# Patient Record
Sex: Male | Born: 1965
Health system: Southern US, Community
[De-identification: ages and names within clinical notes are randomized; demographics above are authoritative.]

## PROBLEM LIST (undated history)

## (undated) DIAGNOSIS — R079 Chest pain, unspecified: Secondary | ICD-10-CM

## (undated) DIAGNOSIS — K219 Gastro-esophageal reflux disease without esophagitis: Secondary | ICD-10-CM

## (undated) HISTORY — DX: Chest pain, unspecified: R07.9

## (undated) HISTORY — DX: Gastro-esophageal reflux disease without esophagitis: K21.9

---

## 2011-11-25 ENCOUNTER — Ambulatory Visit (INDEPENDENT_AMBULATORY_CARE_PROVIDER_SITE_OTHER): Payer: BC Managed Care – PPO | Admitting: Family Medicine

## 2011-11-25 VITALS — BP 129/69 | HR 80 | Temp 97.4°F | Resp 16 | Ht 70.0 in | Wt 187.0 lb

## 2011-11-25 DIAGNOSIS — R079 Chest pain, unspecified: Secondary | ICD-10-CM

## 2011-11-25 NOTE — Progress Notes (Signed)
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 His wife made him come in today because he mentioned pain. Patient's pain began on Thursday after work and it feels like the same kind of pain he had after eating onions in the past. Feeling in his lower chest anteriorly. The pain is coming on (intermittent) and although it started as an 8/10, currently at 1/10. Patient thinks may be Collins on Wednesday night and this started the problem.  Patient's risk factors for coronary disease include early death of father at age 13 of heart disease, cigarette smoking, and heavy alcohol use. He has no history of high blood pressure and he does not know his cholesterol.  Currently patient says that he feels like he has a "soft spot) over his lower sternum. The pain is non-positional and does not change with activity.  Patient denies dizziness, shortness of breath, swelling, weakness, or inability to work.  Objective: Patient is happy, pleasant and articulate. He is seen with his wife in the room.  HEENT: Unremarkable Neck: Supple no adenopathy Body habitus: Patient looks extremely fit and then. Chest: Clear Heart: Regular, no murmur or gallop Abdomen: Soft nontender without HSM or masses.  EKG: Normal sinus rhythm  Assessment:  No evidence for coronary disease at this point nevertheless patient does have some significant risk factors for heart disease. I've encouraged him to cut down smoking and drinking. Needs to eliminate the smoking and limit drinking 2 beers per day.  Plan:  Cardiac referral, ranitidine when necessary, work on cigarette reduction

## 2011-11-25 NOTE — Patient Instructions (Signed)
Chest Pain (Nonspecific) It is often hard to give a specific diagnosis for the cause of chest pain. There is always a chance that your pain could be related to something serious, such as a heart attack or a blood clot in the lungs. You need to follow up with your caregiver for further evaluation. CAUSES   Heartburn.   Pneumonia or bronchitis.   Anxiety or stress.   Inflammation around your heart (pericarditis) or lung (pleuritis or pleurisy).   A blood clot in the lung.   A collapsed lung (pneumothorax). It can develop suddenly on its own (spontaneous pneumothorax) or from injury (trauma) to the chest.   Shingles infection (herpes zoster virus).  The chest wall is composed of bones, muscles, and cartilage. Any of these can be the source of the pain.  The bones can be bruised by injury.   The muscles or cartilage can be strained by coughing or overwork.   The cartilage can be affected by inflammation and become sore (costochondritis).  DIAGNOSIS  Lab tests or other studies, such as X-rays, electrocardiography, stress testing, or cardiac imaging, may be needed to find the cause of your pain.  TREATMENT   Treatment depends on what may be causing your chest pain. Treatment may include:   Acid blockers for heartburn.   Anti-inflammatory medicine.   Pain medicine for inflammatory conditions.   Antibiotics if an infection is present.   You may be advised to change lifestyle habits. This includes stopping smoking and avoiding alcohol, caffeine, and chocolate.   You may be advised to keep your head raised (elevated) when sleeping. This reduces the chance of acid going backward from your stomach into your esophagus.   Most of the time, nonspecific chest pain will improve within 2 to 3 days with rest and mild pain medicine.  HOME CARE INSTRUCTIONS   If antibiotics were prescribed, take your antibiotics as directed. Finish them even if you start to feel better.   For the next few  days, avoid physical activities that bring on chest pain. Continue physical activities as directed.   Do not smoke.   Avoid drinking alcohol.   Only take over-the-counter or prescription medicine for pain, discomfort, or fever as directed by your caregiver.   Follow your caregiver's suggestions for further testing if your chest pain does not go away.   Keep any follow-up appointments you made. If you do not go to an appointment, you could develop lasting (chronic) problems with pain. If there is any problem keeping an appointment, you must call to reschedule.  SEEK MEDICAL CARE IF:   You think you are having problems from the medicine you are taking. Read your medicine instructions carefully.   Your chest pain does not go away, even after treatment.   You develop a rash with blisters on your chest.  SEEK IMMEDIATE MEDICAL CARE IF:   You have increased chest pain or pain that spreads to your arm, neck, jaw, back, or abdomen.   You develop shortness of breath, an increasing cough, or you are coughing up blood.   You have severe back or abdominal pain, feel nauseous, or vomit.   You develop severe weakness, fainting, or chills.   You have a fever.  THIS IS AN EMERGENCY. Do not wait to see if the pain will go away. Get medical help at once. Call your local emergency services (911 in [place] Do not drive yourself to the hospital. MAKE SURE YOU:   Understand these instructions.     Will watch your condition.   Will get help right away if you are not doing well or get worse.  Document Released: 03/21/2005 Document Revised: 05/31/2011 Document Reviewed: 01/15/2008 Exit[employer] Patient Information 2012 [employer]. 

## 2011-12-26 ENCOUNTER — Encounter: Payer: BC Managed Care – PPO | Admitting: Cardiovascular Disease

## 2011-12-26 ENCOUNTER — Encounter: Payer: Self-pay | Admitting: Cardiovascular Disease

## 2011-12-26 ENCOUNTER — Ambulatory Visit (INDEPENDENT_AMBULATORY_CARE_PROVIDER_SITE_OTHER): Payer: BC Managed Care – PPO | Admitting: Cardiovascular Disease

## 2011-12-26 DIAGNOSIS — R079 Chest pain, unspecified: Secondary | ICD-10-CM

## 2011-12-26 DIAGNOSIS — F17200 Nicotine dependence, unspecified, uncomplicated: Secondary | ICD-10-CM

## 2011-12-26 DIAGNOSIS — K219 Gastro-esophageal reflux disease without esophagitis: Secondary | ICD-10-CM | POA: Insufficient documentation

## 2011-12-26 NOTE — Patient Instructions (Signed)
Your physician recommends that you schedule a follow-up appointment in: AS NEEDED  Your physician has requested that you have an exercise tolerance test. For further information please visit [email or web address]. Please also follow instruction sheet, as given. DX CHEST PAIN 

## 2011-12-26 NOTE — Assessment & Plan Note (Signed)
See above.  Family history and smoker Normal ECG  F/U ETT

## 2011-12-26 NOTE — Assessment & Plan Note (Signed)
Releated to onion ingestion Continue zantac Chest pain likely related to esophageal spasm

## 2011-12-26 NOTE — Assessment & Plan Note (Signed)
Counseled for less than 10 minutes  Wife is supportive Will call in welbutrin if needed.  Work related peer pressure.

## 2011-12-26 NOTE — Progress Notes (Signed)
Patient ID: Dale Butler, male   DOB: Mar 19, 1966, 46 y.o.   MRN: [id number] 46 yo referred by Dr Milus Glazier for chest pain.  Had episode at work going into [employer], structural Occurred after lunch and having onions.  This has happened in past and has GI overtones.  Squeezing in center of chest.  Lasted about 40 minutes.  Associated diaphoresis and dyspnea.  Family history of CAD.  Smokes 4 cigs/day.  On no meds.  No exertional pains in general.  Has not had recurrence last couple of weeks.  ECG was normal  ROS: Denies fever, malais, weight loss, blurry vision, decreased visual acuity, cough, sputum, SOB, hemoptysis, pleuritic pain, palpitaitons, heartburn, abdominal pain, melena, lower extremity edema, claudication, or rash.  All other systems reviewed and negative   General: Affect appropriate Healthy:  appears stated age HEENT: normal Neck supple with no adenopathy JVP normal no bruits no thyromegaly Lungs clear with no wheezing and good diaphragmatic motion Heart:  S1/S2 no murmur,rub, gallop or click PMI normal Abdomen: benighn, BS positve, no tenderness, no AAA no bruit.  No HSM or HJR Distal pulses intact with no bruits No edema Neuro non-focal Skin warm and dry No muscular weakness  Medications Current Outpatient Prescriptions  Medication Sig Dispense Refill  . ranitidine (ZANTAC) 150 MG tablet Take 150 mg by mouth 2 (two) times daily. If needed        Allergies Review of patient's allergies indicates no known allergies.  Family History: Family History  Problem Relation Age of Onset  . Hypertension Father   . Heart disease Father   . Alcohol abuse Brother     Social History: History   Social History  . Marital Status: Married    Spouse Name: N/A    Number of Children: N/A  . Years of Education: N/A   Occupational History  . Not on file.   Social History Main Topics  . Smoking status: Current Everyday Smoker  . Smokeless tobacco: Not on file  . Alcohol Use: Not on  file  . Drug Use: Not on file  . Sexually Active: Not on file   Other Topics Concern  . Not on file   Social History Narrative  . No narrative on file    Electrocardiogram:  NSR normal ecg 11/26/11  Assessment and Plan

## 2011-12-27 NOTE — Progress Notes (Signed)
Patient ID: Dale Butler, male   DOB: 30-Jan-1966, 46 y.o.   MRN: [id number] Pt seen by Dr. Eden Emms. cdm

## 2011-12-27 NOTE — Progress Notes (Signed)
Pt seen by Nishan. cdm

## 2012-01-17 ENCOUNTER — Encounter: Payer: BC Managed Care – PPO | Admitting: Nurse Practitioner

## 2012-08-06 ENCOUNTER — Ambulatory Visit (INDEPENDENT_AMBULATORY_CARE_PROVIDER_SITE_OTHER): Payer: BC Managed Care – PPO | Admitting: Family Medicine

## 2012-08-06 VITALS — BP 120/82 | HR 97 | Temp 97.4°F | Resp 16 | Ht 71.5 in | Wt 193.6 lb

## 2012-08-06 DIAGNOSIS — Z23 Encounter for immunization: Secondary | ICD-10-CM

## 2012-08-06 DIAGNOSIS — R21 Rash and other nonspecific skin eruption: Secondary | ICD-10-CM

## 2012-08-06 DIAGNOSIS — Z0000 Encounter for general adult medical examination without abnormal findings: Secondary | ICD-10-CM

## 2012-08-06 LAB — LIPID PANEL
HDL: 47 mg/dL (ref >39)
LDL Cholesterol: 140 mg/dL — ABNORMAL HIGH (ref 0–99)
Triglycerides: 296 mg/dL — ABNORMAL HIGH (ref <150)
VLDL: 59 mg/dL — ABNORMAL HIGH (ref 0–40)

## 2012-08-06 LAB — COMPREHENSIVE METABOLIC PANEL
ALT: 17 U/L (ref 0–53)
AST: 33 U/L (ref 0–37)
Albumin: 4.7 g/dL (ref 3.5–5.2)
Alkaline Phosphatase: 67 U/L (ref 39–117)
Calcium: 9.5 mg/dL (ref 8.4–10.5)
Chloride: 101 mEq/L (ref 96–112)
Potassium: 4.3 mEq/L (ref 3.5–5.3)
Sodium: 136 mEq/L (ref 135–145)

## 2012-08-06 LAB — POCT CBC
Granulocyte percent: 40.7 %G (ref 37–80)
MID (cbc): 0.5 (ref 0–0.9)
MPV: 8.5 fL (ref 0–99.8)
POC Granulocyte: 2.5 (ref 2–6.9)
POC LYMPH PERCENT: 51.2 %L — AB (ref 10–50)
POC MID %: 8.1 %M (ref 0–12)
Platelet Count, POC: 307 10*3/uL (ref 142–424)
RBC: 5.07 M/uL (ref 4.69–6.13)
RDW, POC: 13.6 %

## 2012-08-06 MED ORDER — TRIAMCINOLONE ACETONIDE 0.1 % EX CREA: TOPICAL_CREAM | Freq: Two times a day (BID) | CUTANEOUS | Status: DC

## 2012-08-06 NOTE — Patient Instructions (Addendum)
Use the triamcinolone cream twice a day for 2 weeks- if this is not resolving your rash please let me know, sooner if getting worse.  If your rash does not resolve we can send you to a dermatologist or perform a biopsy here at clinic.  If the rash is responding you can use "pulse therapy;" use the medicine when flaring, and stop when better.  It may take several months for the skin darkening to resolve.    I will be in touch with your labs as soon as they are available

## 2012-08-06 NOTE — Progress Notes (Signed)
[facility] [address] [place] [address] [phone]  Date:  08/06/2012   Name:  Dale Butler   DOB:  Nov 22, 1965   MRN:  [id number]  PCP:  Shade Flood, MD    Chief Complaint: rash on Left leg   History of Present Illness:  Dale Butler is a 47 y.o. very pleasant male patient who presents with the following:  He has noted a rash on his left leg for about 6 months.  It seemed to be getting better, but then a second area appeared.  He has used some vaseline/ neosporin/ lotion for dryness.  It itches a little bit.  He does wear long johns a lot in the colder months, and is not sure if this is related.  However, he does not have the same problem on the opposite leg  Gina and his wife are concerned about temperature control issues- they report that although his body feels warm to the touch, he often is cold when others are comfortable.  They would be interested in checking his thyroid today  He is fasting today so far except for coffee with cream and sugar.  He has not had a physical exam in some time and would like to do any necessary labs as long as we are drawing blood today.  He is also due for a tetanus shot (he is unsure of the date of his last shot but thinks it has been a long time) and would like a seasonal flu shot  Patient Active Problem List  Diagnosis  . GERD (gastroesophageal reflux disease)  . Smoker  . Chest pain    Past Medical History  Diagnosis Date  . Chest pain     History reviewed. No pertinent past surgical history.  History  Substance Use Topics  . Smoking status: Current Every Day Smoker -- 0.25 packs/day  . Smokeless tobacco: Never Used  . Alcohol Use: No    Family History  Problem Relation Age of Onset  . Hypertension Father   . Heart disease Father   . Alcohol abuse Brother     No Known Allergies  Medication list has been reviewed and updated.  Current Outpatient Prescriptions on File Prior to  Visit  Medication Sig Dispense Refill  . ranitidine (ZANTAC) 150 MG tablet Take 150 mg by mouth 2 (two) times daily. If needed       No current facility-administered medications on file prior to visit.    Review of Systems:  As per HPI- otherwise negative.   Physical Examination: Filed Vitals:   08/06/12 0800  BP: 120/82  Pulse: 97  Temp: 97.4 F (36.3 C)  Resp: 16   Filed Vitals:   08/06/12 0800  Height: 5' 11.5" (1.816 m)  Weight: 193 lb 9.6 oz (87.816 kg)   Body mass index is 26.63 kg/(m^2). Ideal Body Weight: Weight in (lb) to have BMI = 25: 181.4  GEN: WDWN, NAD, Non-toxic, A & O x 3 HEENT: Atraumatic, Normocephalic. Neck supple. No masses, No LAD.  Bilateral TM wnl, oropharynx normal.  PEERL,EOMI.   Ears and Nose: No external deformity. CV: RRR, No M/G/R. No JVD. No thrill. No extra heart sounds. PULM: CTA B, no wheezes, crackles, rhonchi. No retractions. No resp. distress. No accessory muscle use. ABD: S, NT, ND. No rebound. No HSM. EXTR: No c/c/e NEURO Normal gait.  PSYCH: Normally interactive. Conversant. Not depressed or anxious appearing.  Calm demeanor.  Left shin: there are 2 excoriated,  hyperpigmented macules which appear chronically irritated and thickened.  The proximal lesion occurred first and is about 3x4 cm in size, the more distal lesion is smaller.    Assessment and Plan: 1. Rash and nonspecific skin eruption  triamcinolone cream (KENALOG) 0.1 %   triamcinolone cream (KENALOG) 0.1 %  2. Physical exam, annual  POCT CBC   POCT CBC   Comprehensive metabolic panel   TSH   Lipid panel   Flu vaccine greater than or equal to 3yo preservative free IM   Tdap vaccine greater than or equal to 7yo IM    Treat for non- specific dermatitis with triamcinolone cream as above- if not better will pursue a bx.  Will plan further follow- up pending labs.  See patient instructions for more details.    Immunization update today  Abbe Amsterdam, MD

## 2012-08-09 ENCOUNTER — Encounter: Payer: Self-pay | Admitting: Family Medicine

## 2012-08-12 NOTE — Addendum Note (Signed)
Addended by: Abbe Amsterdam C on: 08/12/2012 08:36 AM   Modules accepted: Level of Service

## 2014-11-04 ENCOUNTER — Ambulatory Visit (INDEPENDENT_AMBULATORY_CARE_PROVIDER_SITE_OTHER): Payer: BLUE CROSS/BLUE SHIELD

## 2014-11-04 ENCOUNTER — Ambulatory Visit (INDEPENDENT_AMBULATORY_CARE_PROVIDER_SITE_OTHER): Payer: BLUE CROSS/BLUE SHIELD | Admitting: Family Medicine

## 2014-11-04 VITALS — BP 134/70 | HR 102 | Temp 98.1°F | Resp 20 | Ht 71.0 in | Wt 189.2 lb

## 2014-11-04 DIAGNOSIS — M25512 Pain in left shoulder: Secondary | ICD-10-CM

## 2014-11-04 MED ORDER — MELOXICAM 7.5 MG PO TABS: 7.5000 mg | ORAL_TABLET | Freq: Every day | ORAL | Status: DC | PRN

## 2014-11-04 NOTE — Progress Notes (Addendum)
Subjective:  This chart was scribed for Merri Ray, MD by [facility], medical scribe at [facility].The patient was seen in exam room 05 and the patient's care was started at 5:33 PM.   Patient ID: Dale Butler, male    DOB: December 31, 1965, 49 y.o.   MRN: [id number] Chief Complaint  Patient presents with  . Shoulder Pain    left shoulder pain x 2 months.  pain later in the day and some better with ice. denies any numbness or tingling in hands or fingers   HPI HPI Comments: Dale Butler is a 49 y.o. male who presents to [facility] complaining of left shoulder pain ongoing for two months. Pt plays a lot of golf. He first noticed redness on his shoulder after getting out the shower. Pin or arrow pinch on his shoulder around 3:30 PM which radiates to the back of his shoulder. He ices the shoulder for relief. He may take an aleve once a week. 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 Working less over the past month. He is right hand dominant. He denies neck pain.  Patient Active Problem List   Diagnosis Date Noted  . GERD (gastroesophageal reflux disease) 12/26/2011  . Smoker 12/26/2011  . Chest pain 12/26/2011   Past Medical History  Diagnosis Date  . Chest pain    History reviewed. No pertinent past surgical history. No Known Allergies Prior to Admission medications   Medication Sig Start Date End Date Taking? Authorizing Provider  ranitidine (ZANTAC) 150 MG tablet Take 150 mg by mouth 2 (two) times daily. If needed    Historical Provider, MD  triamcinolone cream (KENALOG) 0.1 % Apply topically 2 (two) times daily. Patient not taking: Reported on 11/04/2014 08/06/12   Darreld Mclean, MD   History   Social History  . Marital Status: Married    Spouse Name: N/A  . Number of Children: N/A  . Years of Education: N/A   Occupational History  . Not on file.   Social History Main Topics  . Smoking status:  Current Every Day Smoker -- 0.25 packs/day  . Smokeless tobacco: Never Used  . Alcohol Use: No  . Drug Use: No  . Sexual Activity: Not on file   Other Topics Concern  . Not on file   Social History Narrative   Review of Systems  Musculoskeletal: Positive for arthralgias.      Objective:  BP 134/70 mmHg  Pulse 102  Temp(Src) 98.1 F (36.7 C) (Oral)  Resp 20  Ht 5\' 11"  (1.803 m)  Wt 189 lb 4 oz (85.843 kg)  BMI 26.41 kg/m2  SpO2 98% Physical Exam  Constitutional: He is oriented to person, place, and time. He appears well-developed and well-nourished. No distress.  HENT:  Head: Normocephalic and atraumatic.  Eyes: Pupils are equal, round, and reactive to light.  Neck: Normal range of motion.  C-spine full ROM, no bony tenderness. Does not reproduce pain  Cardiovascular: Normal rate and regular rhythm.   Pulmonary/Chest: Effort normal. No respiratory distress.  Musculoskeletal: Normal range of motion.  Woodward and AC joint non tender. Full ROM and RTC strength of the left shoulder. Slight discomfort with crossover test. Pain is more anterior lateral than the [facility] joint with this testing.  Negative O' Brains  Negative Neer's Negative Luan Pulling' Negative speeds, yergasons, but is slightly tender over the proximal bicep tendon. No bony ttp.    Negative Clunk with  grind test but slight discomfort with shoulder exam at abduction to approximately 100 degrees.   Neurological: He is alert and oriented to person, place, and time.  Skin: Skin is warm and dry.  Psychiatric: He has a normal mood and affect. His behavior is normal.  Nursing note and vitals reviewed.  [facility] reading (PRIMARY) by  Dr. Carlota Raspberry: old AC separation, no acute bony findings.     Assessment & Plan:  Dale Butler is a 49 y.o. male Left shoulder pain - Plan: DG Shoulder Left   -possible labral pathology vs bicipital tenosynovitis.   -can try meloxicam temporarily and activity modification, decrease frequency of  golf swing that exacerbates the pain, but continue ROM.   -if not improving in next 2 weeks - would refer to ortho - Dr. Onnie Graham for further eval, as may need MR arthrogram.   -rtc sooner if worse.     Meds ordered this encounter  Medications  . meloxicam (MOBIC) 7.5 MG tablet    Sig: Take 1 tablet (7.5 mg total) by mouth daily as needed for pain.    Dispense:  30 tablet    Refill:  0   There are no Patient Instructions on file for this visit.   I personally performed the services described in this documentation, which was scribed in my presence. The recorded information has been reviewed and considered, and addended by me as needed.

## 2014-11-04 NOTE — Patient Instructions (Signed)
Try the mobic each morning (do not combine with other over the counter pain relievers),  Heat or ice to area as needed, and if not improving in next few weeks - let me know and I can refer you to Dr. Onnie Graham.   Return to the clinic or go to the nearest emergency room if any of your symptoms worsen or new symptoms occur.

## 2015-03-23 ENCOUNTER — Telehealth: Payer: Self-pay

## 2015-03-23 DIAGNOSIS — M25512 Pain in left shoulder: Secondary | ICD-10-CM

## 2015-03-23 NOTE — Telephone Encounter (Signed)
Assessment & Plan:  Dale Butler is a 49 y.o. male Left shoulder pain - Plan: DG Shoulder Left  -possible labral pathology vs bicipital tenosynovitis.  -can try meloxicam temporarily and activity modification, decrease frequency of golf swing that exacerbates the pain, but continue ROM.  -if not improving in next 2 weeks - would refer to ortho - Dr. Onnie Graham for further eval, as may need MR arthrogram.  -rtc sooner if worse.          Referral in.

## 2015-03-23 NOTE — Telephone Encounter (Signed)
Pt states he would like to have a referral to see a specialist at [facility], was told if medicine didn't help, he need a referral. Please call [phone]

## 2016-05-17 IMAGING — CR DG SHOULDER 2+V*L*
3 series · 3 of 3 positions shown · non-contrast
Comparison: None.

CLINICAL DATA: Left shoulder pain for 2 months. Repetitive motion
at work. No acute injury. Initial encounter.

EXAM:
LEFT SHOULDER - 2+ VIEW

[AP (1 of 2)]
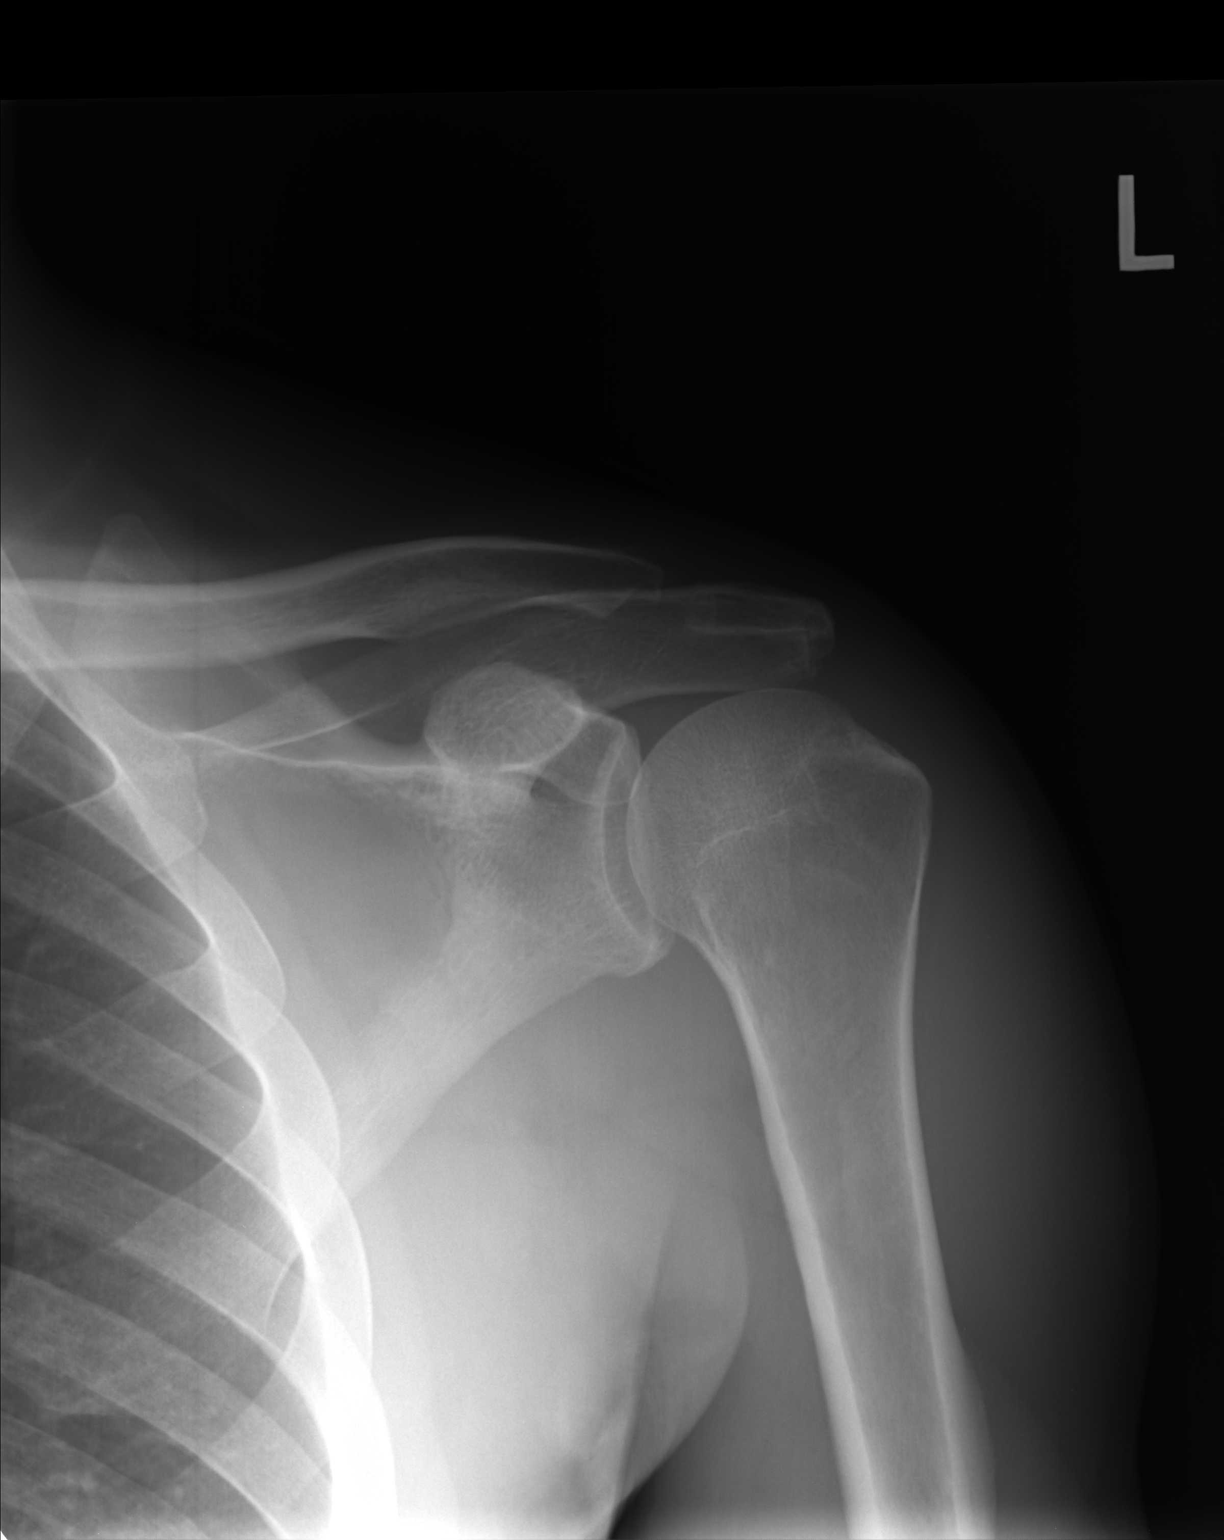

[axial]
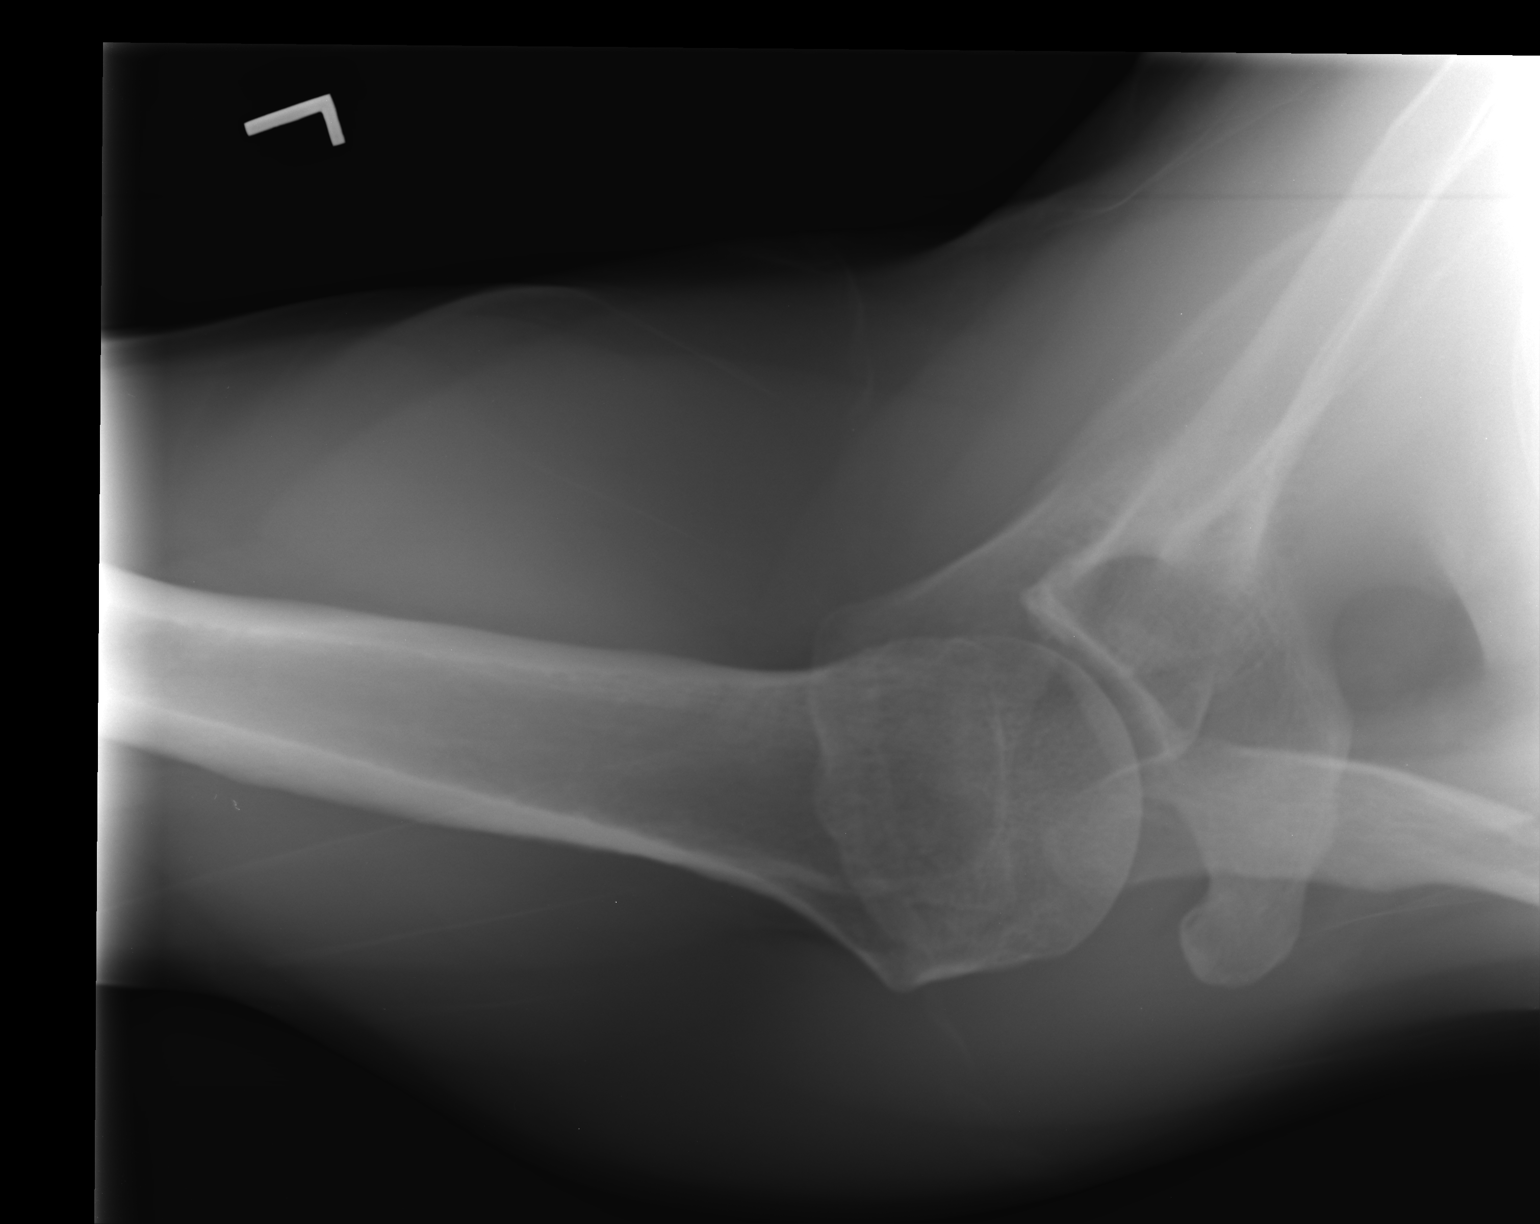

[AP (2 of 2)]
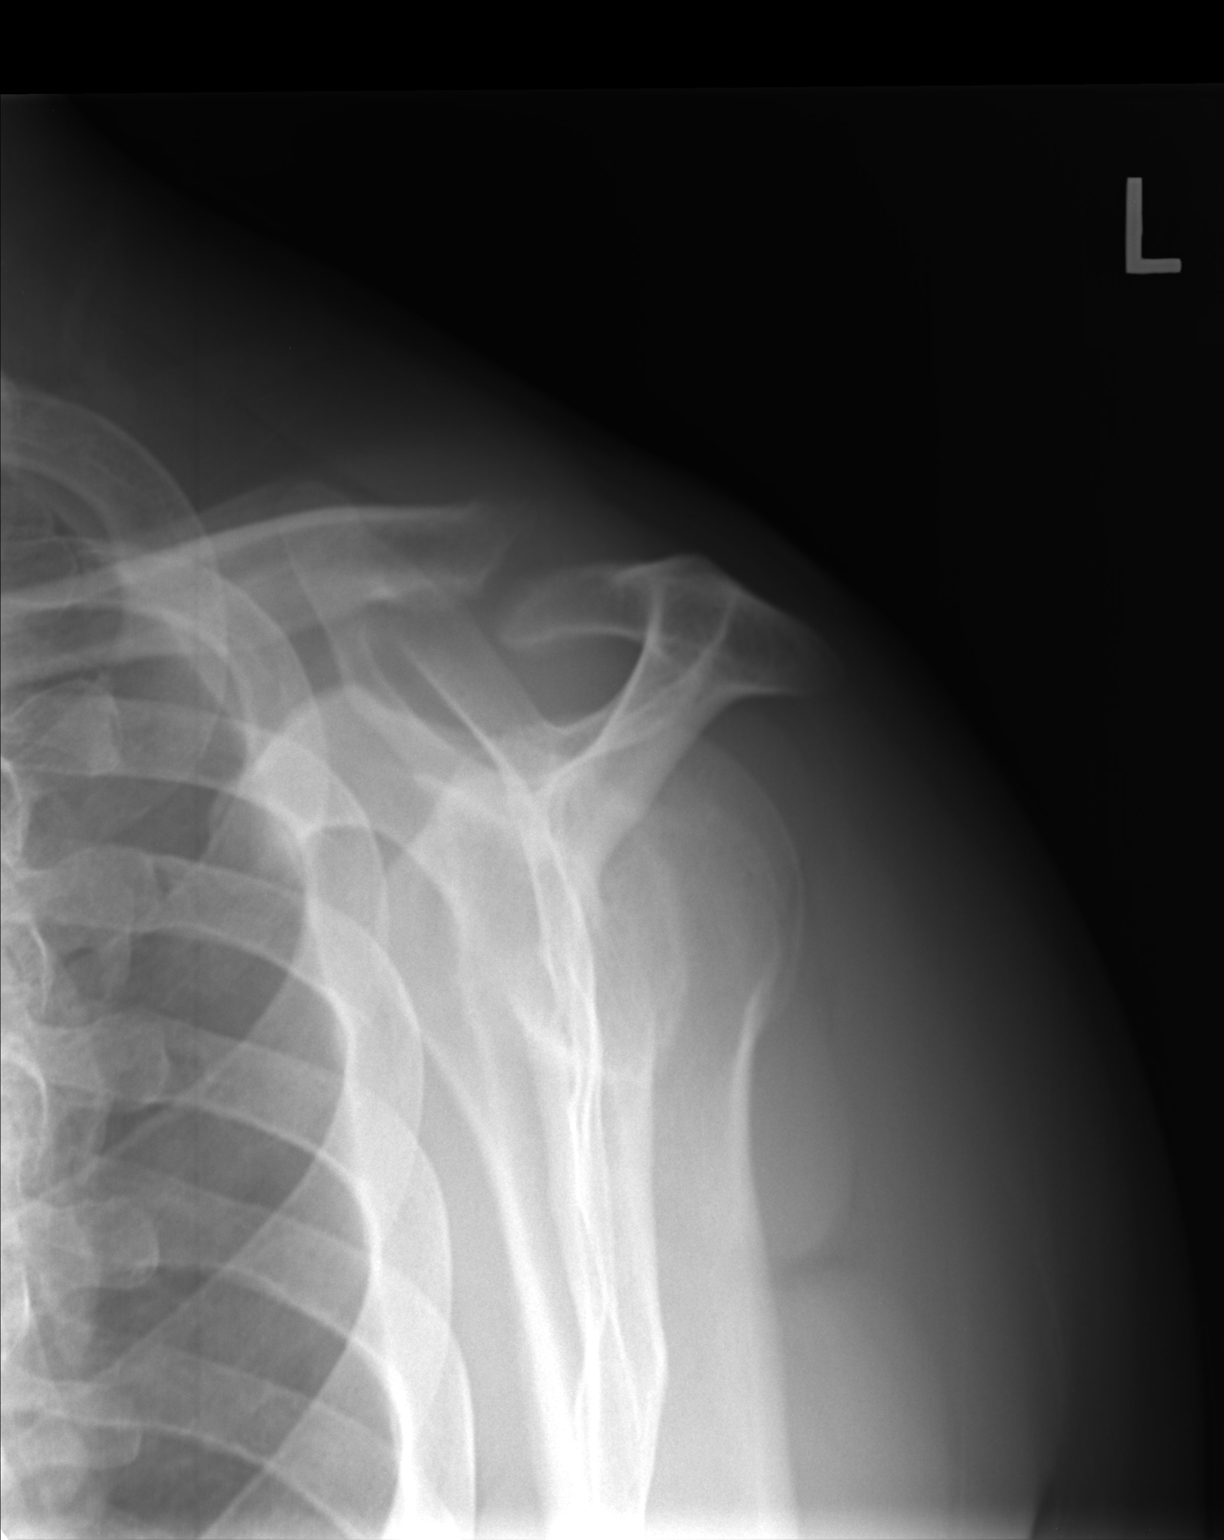

[3 of 3 positions shown; findings below may reference images not displayed]

FINDINGS: There is motion on the Y-view. No evidence of acute fracture or
dislocation. There are mild acromioclavicular degenerative changes
with questionable postsurgical changes or osteolysis of the distal
clavicle. No significant widening of the AC joint is identified. The
subacromial space is preserved.
IMPRESSION: No acute osseous findings. Acromioclavicular degenerative changes
with possible distal clavicular osteolysis or prior surgery.

## 2017-09-12 ENCOUNTER — Encounter: Payer: BLUE CROSS/BLUE SHIELD | Admitting: Family Medicine

## 2017-09-16 ENCOUNTER — Encounter: Payer: Self-pay | Admitting: Family Medicine

## 2017-09-16 ENCOUNTER — Other Ambulatory Visit: Payer: Self-pay

## 2017-09-16 ENCOUNTER — Ambulatory Visit (INDEPENDENT_AMBULATORY_CARE_PROVIDER_SITE_OTHER): Payer: BLUE CROSS/BLUE SHIELD | Admitting: Family Medicine

## 2017-09-16 VITALS — BP 128/82 | HR 87 | Temp 98.8°F | Resp 18 | Ht 69.49 in | Wt 187.6 lb

## 2017-09-16 DIAGNOSIS — Z1211 Encounter for screening for malignant neoplasm of colon: Secondary | ICD-10-CM

## 2017-09-16 DIAGNOSIS — Z125 Encounter for screening for malignant neoplasm of prostate: Secondary | ICD-10-CM | POA: Diagnosis not present

## 2017-09-16 DIAGNOSIS — Z720 Tobacco use: Secondary | ICD-10-CM | POA: Diagnosis not present

## 2017-09-16 DIAGNOSIS — Z0000 Encounter for general adult medical examination without abnormal findings: Secondary | ICD-10-CM

## 2017-09-16 DIAGNOSIS — Z13220 Encounter for screening for lipoid disorders: Secondary | ICD-10-CM | POA: Diagnosis not present

## 2017-09-16 DIAGNOSIS — R0683 Snoring: Secondary | ICD-10-CM

## 2017-09-16 DIAGNOSIS — Z131 Encounter for screening for diabetes mellitus: Secondary | ICD-10-CM | POA: Diagnosis not present

## 2017-09-16 NOTE — Patient Instructions (Addendum)
With snoring, I would consider meeting with sleep specialist to see if you need to testing done. I will refer you to determine if further testing needed.  Cut back to no more than 1-2 beers per day on average. If you do have difficulty cutting back, please let me know.   Let me know if you need help quitting smoking.   Thank you for coming in today.   Steps to Quit Smoking Smoking tobacco can be bad for your health. It can also affect almost every organ in your body. Smoking puts you and people around you at risk for many serious long-lasting (chronic) diseases. Quitting smoking is hard, but it is one of the best things that you can do for your health. It is never too late to quit. What are the benefits of quitting smoking? When you quit smoking, you lower your risk for getting serious diseases and conditions. They can include:  Lung cancer or lung disease.  Heart disease.  Stroke.  Heart attack.  Not being able to have children (infertility).  Weak bones (osteoporosis) and broken bones (fractures).  If you have coughing, wheezing, and shortness of breath, those symptoms may get better when you quit. You may also get sick less often. If you are pregnant, quitting smoking can help to lower your chances of having a baby of low birth weight. What can I do to help me quit smoking? Talk with your doctor about what can help you quit smoking. Some things you can do (strategies) include:  Quitting smoking totally, instead of slowly cutting back how much you smoke over a period of time.  Going to in-person counseling. You are more likely to quit if you go to many counseling sessions.  Using resources and support systems, such as: ? Database administrator with a Social worker. ? Phone quitlines. ? Careers information officer. ? Support groups or group counseling. ? Text messaging programs. ? Mobile phone apps or applications.  Taking medicines. Some of these medicines may have nicotine in them. If  you are pregnant or breastfeeding, do not take any medicines to quit smoking unless your doctor says it is okay. Talk with your doctor about counseling or other things that can help you.  Talk with your doctor about using more than one strategy at the same time, such as taking medicines while you are also going to in-person counseling. This can help make quitting easier. What things can I do to make it easier to quit? Quitting smoking might feel very hard at first, but there is a lot that you can do to make it easier. Take these steps:  Talk to your family and friends. Ask them to support and encourage you.  Call phone quitlines, reach out to support groups, or work with a Social worker.  Ask people who smoke to not smoke around you.  Avoid places that make you want (trigger) to smoke, such as: ? Bars. ? Parties. ? Smoke-break areas at work.  Spend time with people who do not smoke.  Lower the stress in your life. Stress can make you want to smoke. Try these things to help your stress: ? Getting regular exercise. ? Deep-breathing exercises. ? Yoga. ? Meditating. ? Doing a body scan. To do this, close your eyes, focus on one area of your body at a time from head to toe, and notice which parts of your body are tense. Try to relax the muscles in those areas.  Download or buy apps on your mobile phone  or tablet that can help you stick to your quit plan. There are many free apps, such as QuitGuide from the [employer] Office manager for Disease Control and Prevention). You can find more support from smokefree.gov and other websites.  This information is not intended to replace advice given to you by your health care provider. Make sure you discuss any questions you have with your health care provider. Document Released: 04/07/2009 Document Revised: 02/07/2016 Document Reviewed: 10/26/2014 Elsevier Interactive Patient Education  2018 [place] with Quitting Smoking Quitting smoking is a  physical and mental challenge. You will face cravings, withdrawal symptoms, and temptation. Before quitting, work with your health care provider to make a plan that can help you cope. Preparation can help you quit and keep you from giving in. How can I cope with cravings? Cravings usually last for 5-10 minutes. If you get through it, the craving will pass. Consider taking the following actions to help you cope with cravings:  Keep your mouth busy: ? Chew sugar-free gum. ? Suck on hard candies or a straw. ? Brush your teeth.  Keep your hands and body busy: ? Immediately change to a different activity when you feel a craving. ? Squeeze or play with a ball. ? Do an activity or a hobby, like making bead jewelry, practicing needlepoint, or working with wood. ? Mix up your normal routine. ? Take a short exercise break. Go for a quick walk or run up and down stairs. ? Spend time in public places where smoking is not allowed.  Focus on doing something kind or helpful for someone else.  Call a friend or family member to talk during a craving.  Join a support group.  Call a quit line, such as 1-800-QUIT-NOW.  Talk with your health care provider about medicines that might help you cope with cravings and make quitting easier for you.  How can I deal with withdrawal symptoms? Your body may experience negative effects as it tries to get used to not having nicotine in the system. These effects are called withdrawal symptoms. They may include:  Feeling hungrier than normal.  Trouble concentrating.  Irritability.  Trouble sleeping.  Feeling depressed.  Restlessness and agitation.  Craving a cigarette.  To manage withdrawal symptoms:  Avoid places, people, and activities that trigger your cravings.  Remember why you want to quit.  Get plenty of sleep.  Avoid coffee and other caffeinated drinks. These may worsen some of your symptoms.  How can I handle social situations? Social  situations can be difficult when you are quitting smoking, especially in the first few weeks. To manage this, you can:  Avoid parties, bars, and other social situations where people might be smoking.  Avoid alcohol.  Leave right away if you have the urge to smoke.  Explain to your family and friends that you are quitting smoking. Ask for understanding and support.  Plan activities with friends or family where smoking is not an option.  What are some ways I can cope with stress? Wanting to smoke may cause stress, and stress can make you want to smoke. Find ways to manage your stress. Relaxation techniques can help. For example:  Breathe slowly and deeply, in through your nose and out through your mouth.  Listen to soothing, relaxing music.  Talk with a family member or friend about your stress.  Light a candle.  Soak in a bath or take a shower.  Think about a peaceful place.  What are some  ways I can prevent weight gain? Be aware that many people gain weight after they quit smoking. However, not everyone does. To keep from gaining weight, have a plan in place before you quit and stick to the plan after you quit. Your plan should include:  Having healthy snacks. When you have a craving, it may help to: ? Eat plain popcorn, crunchy carrots, celery, or other cut vegetables. ? Chew sugar-free gum.  Changing how you eat: ? Eat small portion sizes at meals. ? Eat 4-6 small meals throughout the day instead of 1-2 large meals a day. ? Be mindful when you eat. Do not watch television or do other things that might distract you as you eat.  Exercising regularly: ? Make time to exercise each day. If you do not have time for a long workout, do short bouts of exercise for 5-10 minutes several times a day. ? Do some form of strengthening exercise, like weight lifting, and some form of aerobic exercise, like running or swimming.  Drinking plenty of water or other low-calorie or no-calorie  drinks. Drink 6-8 glasses of water daily, or as much as instructed by your health care provider.  Summary  Quitting smoking is a physical and mental challenge. You will face cravings, withdrawal symptoms, and temptation to smoke again. Preparation can help you as you go through these challenges.  You can cope with cravings by keeping your mouth busy (such as by chewing gum), keeping your body and hands busy, and making calls to family, friends, or a helpline for people who want to quit smoking.  You can cope with withdrawal symptoms by avoiding places where people smoke, avoiding drinks with caffeine, and getting plenty of rest.  Ask your health care provider about the different ways to prevent weight gain, avoid stress, and handle social situations. This information is not intended to replace advice given to you by your health care provider. Make sure you discuss any questions you have with your health care provider. Document Released: 06/08/2016 Document Revised: 06/08/2016 Document Reviewed: 06/08/2016 Elsevier Interactive Patient Education  2018 [place] you healthy  Get these tests  Blood pressure- Have your blood pressure checked once a year by your healthcare provider.  Normal blood pressure is 120/80  Weight- Have your body mass index (BMI) calculated to screen for obesity.  BMI is a measure of body fat based on height and weight. You can also calculate your own BMI at [email or web address].[email or web address].  Cholesterol- Have your cholesterol checked every year.  Diabetes- Have your blood sugar checked regularly if you have high blood pressure, high cholesterol, have a family history of diabetes or if you are overweight.  Screening for Colon Cancer- Colonoscopy starting at age 84.  Screening may begin sooner depending on your family history and other health conditions. Follow up colonoscopy as directed by your Gastroenterologist.  Screening for Prostate Cancer- Both blood  work (PSA) and a rectal exam help screen for Prostate Cancer.  Screening begins at age 35 with African-American men and at age 70 with Caucasian men.  Screening may begin sooner depending on your family history.  Take these medicines  Aspirin- One aspirin daily can help prevent Heart disease and Stroke.  Flu shot- Every fall.  Tetanus- Every 10 years.  Zostavax- Once after the age of 53 to prevent Shingles.  Pneumonia shot- Once after the age of 11; if you are younger than 27, ask your healthcare provider if you need a Pneumonia shot.  Take these steps  Don't smoke- If you do smoke, talk to your doctor about quitting.  For tips on how to quit, go to www.smokefree.gov or call 1-800-QUIT-NOW.  Be physically active- Exercise 5 days a week for at least 30 minutes.  If you are not already physically active start slow and gradually work up to 30 minutes of moderate physical activity.  Examples of moderate activity include walking briskly, mowing the yard, dancing, swimming, bicycling, etc.  Eat a healthy diet- Eat a variety of healthy food such as fruits, vegetables, low fat milk, low fat cheese, yogurt, lean meant, poultry, fish, beans, tofu, etc. For more information go to www.thenutritionsource.org  Drink alcohol in moderation- Limit alcohol intake to less than two drinks a day. Never drink and drive.  Dentist- Brush and floss twice daily; visit your dentist twice a year.  Depression- Your emotional health is as important as your physical health. If you're feeling down, or losing interest in things you would normally enjoy please talk to your healthcare provider.  Eye exam- Visit your eye doctor every year.  Safe sex- If you may be exposed to a sexually transmitted infection, use a condom.  Seat belts- Seat belts can save your life; always wear one.  Smoke/Carbon Monoxide detectors- These detectors need to be installed on the appropriate level of your home.  Replace batteries at least  once a year.  Skin cancer- When out in the sun, cover up and use sunscreen 15 SPF or higher.  Violence- If anyone is threatening you, please tell your healthcare provider.  Living Will/ Health care power of attorney- Speak with your healthcare provider and family.   IF you received an x-ray today, you will receive an invoice from [facility]. Please contact [facility] at [phone] with questions or concerns regarding your invoice.   IF you received labwork today, you will receive an invoice from [place] [facility]. Please contact [facility] at [phone] with questions or concerns regarding your invoice.   Our billing staff will not be able to assist you with questions regarding bills from these companies.  You will be contacted with the lab results as soon as they are available. The fastest way to get your results is to activate your My Chart account. Instructions are located on the last page of this paperwork. If you have not heard from [place] regarding the results in 2 weeks, please contact this office.

## 2017-09-16 NOTE — Progress Notes (Signed)
Subjective:  By signing my name below, I, Dale Butler, attest that this documentation has been prepared under the direction and in the presence of Dale Ray, MD. Electronically Signed: Moises Butler, Dale Butler. 09/16/2017 , 4:21 PM .  Patient was seen in Room 11 .   Patient ID: Dale Butler, male    DOB: 28-May-1966, 52 y.o.   MRN: [id number] Chief Complaint  Patient presents with  . Annual Exam   HPI Dale Butler is a 52 y.o. male Here for annual physical. He also has biometric screening form requiring Butler sugar, BP, cholesterol, height and waist circumference. He's a veteran, previously serving Dale Butler.   He works in [employer].   Loud snoring He was informed by his wife with loud snoring for about 6-8 months. He denies pausing his breath at night. He does take naps during his days off, but denies fatigue without the naps. He denies needing to take naps during work.   Alcohol He does drink about 18 pack a week, about 2-3 beers a day after work. He states he's cut down on his alcohol intake.   Smoking He is a current smoker, and plans to cut back.   Family history Biological father- passed away when patient was 46 years old Mother- breast cancer, in remission  Cancer Screening Colonoscopy: not done yet; referred today.  Prostate cancer screening: not done before, but agrees to have done today.   Immunizations Immunization History  Administered Date(s) Administered  . Influenza, Seasonal, Injecte, Preservative Fre 08/06/2012  . Tdap 08/06/2012   Flu shot received this season.  Shingles vaccine: he received shingles vaccine at [facility] last year.   Depression Depression screen [facility] 2/9 09/16/2017 11/04/2014  Decreased Interest 0 0  Down, Depressed, Hopeless 0 0  PHQ - 2 Score 0 0    Vision  Visual Acuity Screening   Right eye Left eye Both eyes  Without correction: 20/25 20/20 20/15-2  With correction:      He has been  checked out for glasses. He will see eye doctor in the next month.   Dentist He is followed by dentist regularly.   Exercise He walks about 7-8 miles a day with physical work. He just finished [employer] and currently working on Science writer work. He also played some golf yesterday.    Patient Active Problem List   Diagnosis Date Noted  . GERD (gastroesophageal reflux disease) 12/26/2011  . Smoker 12/26/2011  . Chest pain 12/26/2011   Past Medical History:  Diagnosis Date  . Chest pain    No past surgical history on file. No Known Allergies Prior to Admission medications   Medication Sig Start Date End Date Taking? Authorizing Provider  meloxicam (MOBIC) 7.5 MG tablet Take 1 tablet (7.5 mg total) by mouth daily as needed for pain. Patient not taking: Reported on 09/16/2017 11/04/14   Dale Agreste, MD  ranitidine (ZANTAC) 150 MG tablet Take 150 mg by mouth 2 (two) times daily. If needed    [provider]   Social History   Socioeconomic History  . Marital status: Married    Spouse name: Not on file  . Number of children: Not on file  . Years of education: Not on file  . Highest education level: Not on file  Occupational History  . Not on file  Social Needs  . Financial resource strain: Not on file  . Food insecurity:    Worry: Not on file  Inability: Not on file  . Transportation needs:    Medical: Not on file    Non-medical: Not on file  Tobacco Use  . Smoking status: Current Every Day Smoker    Packs/day: 0.25  . Smokeless tobacco: Never Used  Substance and Sexual Activity  . Alcohol use: No    Alcohol/week: 0.0 oz  . Drug use: No  . Sexual activity: Not on file  Lifestyle  . Physical activity:    Days per week: Not on file    Minutes per session: Not on file  . Stress: Not on file  Relationships  . Social connections:    Talks on phone: Not on file    Gets together: Not on file    Attends religious service: Not on file     Active member of club or organization: Not on file    Attends meetings of clubs or organizations: Not on file    Relationship status: Not on file  . Intimate partner violence:    Fear of current or ex partner: Not on file    Emotionally abused: Not on file    Physically abused: Not on file    Forced sexual activity: Not on file  Other Topics Concern  . Not on file  Social History Narrative  . Not on file   Review of Systems 13 point ROS - negative     Objective:   Physical Exam  Constitutional: He is oriented to person, place, and time. He appears well-developed and well-nourished.  HENT:  Head: Normocephalic and atraumatic.  Right Ear: External ear normal.  Left Ear: External ear normal.  Mouth/Throat: Oropharynx is clear and moist.  Eyes: Pupils are equal, round, and reactive to light. Conjunctivae and EOM are normal.  Neck: Normal range of motion. Neck supple. No thyromegaly present.  Cardiovascular: Normal rate, regular rhythm, normal heart sounds and intact distal pulses.  Pulmonary/Chest: Effort normal and breath sounds normal. No respiratory distress. He has no wheezes.  Abdominal: Soft. He exhibits no distension. There is no tenderness. Hernia confirmed negative in the right inguinal area and confirmed negative in the left inguinal area.  Genitourinary: Prostate normal.  Musculoskeletal: Normal range of motion. He exhibits no edema or tenderness.  Lymphadenopathy:    He has no cervical adenopathy.  Neurological: He is alert and oriented to person, place, and time. He has normal reflexes.  Skin: Skin is warm and dry.  Psychiatric: He has a normal mood and affect. His behavior is normal.  Vitals reviewed.    Vitals:   09/16/17 1510  BP: 128/82  Pulse: 87  Resp: 18  Temp: 98.8 F (37.1 C)  TempSrc: Oral  SpO2: 98%  Weight: 187 lb 9.6 oz (85.1 kg)  Height: 5' 9.49" (1.765 m)       Assessment & Plan:  Dale Butler is a 52 y.o. male Annual physical  exam  - -anticipatory guidance as below in AVS, screening labs above. Health maintenance items as above in HPI discussed/recommended as applicable.   Snoring - Plan: Ambulatory referral to Sleep Studies  -cut back on alcohol  - refer to sleep specialist to decide if sleep apnea testing indicated.  Tobacco abuse  -Cessation discussed, resources provided.  Screening for prostate cancer - Plan: PSA  - We discussed pros and cons of prostate cancer screening, and after this discussion, he chose to have screening done. PSA obtained, and no concerning findings on DRE.   Special screening for malignant neoplasms, colon -  Plan: Ambulatory referral to Gastroenterology  Screening for diabetes mellitus - Plan: Comprehensive metabolic panel  Screening for hyperlipidemia - Plan: Lipid panel   No orders of the defined types were placed in this encounter.  Patient Instructions   With snoring, I would consider meeting with sleep specialist to see if you need to testing done. I will refer you to determine if further testing needed.  Cut back to no more than 1-2 beers per day on average. If you do have difficulty cutting back, please let me know.   Let me know if you need help quitting smoking.   Thank you for coming in today.   Steps to Quit Smoking Smoking tobacco can be bad for your health. It can also affect almost every organ in your body. Smoking puts you and people around you at risk for many serious long-lasting (chronic) diseases. Quitting smoking is hard, but it is one of the best things that you can do for your health. It is never too late to quit. What are the benefits of quitting smoking? When you quit smoking, you lower your risk for getting serious diseases and conditions. They can include:  Lung cancer or lung disease.  Heart disease.  Stroke.  Heart attack.  Not being able to have children (infertility).  Weak bones (osteoporosis) and broken bones (fractures).  If you  have coughing, wheezing, and shortness of breath, those symptoms may get better when you quit. You may also get sick less often. If you are pregnant, quitting smoking can help to lower your chances of having a baby of low birth weight. What can I do to help me quit smoking? Talk with your doctor about what can help you quit smoking. Some things you can do (strategies) include:  Quitting smoking totally, instead of slowly cutting back how much you smoke over a period of time.  Going to in-person counseling. You are more likely to quit if you go to many counseling sessions.  Using resources and support systems, such as: ? Database administrator with a Social worker. ? Phone quitlines. ? Careers Dale officer. ? Support groups or group counseling. ? Text messaging programs. ? Mobile phone apps or applications.  Taking medicines. Some of these medicines may have nicotine in them. If you are pregnant or breastfeeding, do not take any medicines to quit smoking unless your doctor says it is okay. Talk with your doctor about counseling or other things that can help you.  Talk with your doctor about using more than one strategy at the same time, such as taking medicines while you are also going to in-person counseling. This can help make quitting easier. What things can I do to make it easier to quit? Quitting smoking might feel very hard at first, but there is a lot that you can do to make it easier. Take these steps:  Talk to your family and friends. Ask them to support and encourage you.  Call phone quitlines, reach out to support groups, or work with a Social worker.  Ask people who smoke to not smoke around you.  Avoid places that make you want (trigger) to smoke, such as: ? Bars. ? Parties. ? Smoke-break areas at work.  Spend time with people who do not smoke.  Lower the stress in your life. Stress can make you want to smoke. Try these things to help your stress: ? Getting regular  exercise. ? Deep-breathing exercises. ? Yoga. ? Meditating. ? Doing a body scan. To do this, close  your eyes, focus on one area of your body at a time from head to toe, and notice which parts of your body are tense. Try to relax the muscles in those areas.  Download or buy apps on your mobile phone or tablet that can help you stick to your quit plan. There are many free apps, such as QuitGuide from the [employer] Butler for Disease Control and Prevention). You can find more support from smokefree.gov and other websites.  This Dale is not intended to replace advice given to you by your health care provider. Make sure you discuss any questions you have with your health care provider. Document Released: 04/07/2009 Document Revised: 02/07/2016 Document Reviewed: 10/26/2014 Elsevier Interactive Patient Education  2018 [place] with Quitting Smoking Quitting smoking is a physical and mental challenge. You will face cravings, withdrawal symptoms, and temptation. Before quitting, work with your health care provider to make a plan that can help you cope. Preparation can help you quit and keep you from giving in. How can I cope with cravings? Cravings usually last for 5-10 minutes. If you get through it, the craving will pass. Consider taking the following actions to help you cope with cravings:  Keep your mouth busy: ? Chew sugar-free gum. ? Suck on hard candies or a straw. ? Brush your teeth.  Keep your hands and body busy: ? Immediately change to a different activity when you feel a craving. ? Squeeze or play with a ball. ? Do an activity or a hobby, like making bead jewelry, practicing needlepoint, or working with wood. ? Mix up your normal routine. ? Take a short exercise break. Go for a quick walk or run up and down stairs. ? Spend time in public places where smoking is not allowed.  Focus on doing something kind or helpful for someone else.  Call a friend or family member  to talk during a craving.  Join a support group.  Call a quit line, such as 1-800-QUIT-NOW.  Talk with your health care provider about medicines that might help you cope with cravings and make quitting easier for you.  How can I deal with withdrawal symptoms? Your body may experience negative effects as it tries to get used to not having nicotine in the system. These effects are called withdrawal symptoms. They may include:  Feeling hungrier than normal.  Trouble concentrating.  Irritability.  Trouble sleeping.  Feeling depressed.  Restlessness and agitation.  Craving a cigarette.  To manage withdrawal symptoms:  Avoid places, people, and activities that trigger your cravings.  Remember why you want to quit.  Get plenty of sleep.  Avoid coffee and other caffeinated drinks. These may worsen some of your symptoms.  How can I handle social situations? Social situations can be difficult when you are quitting smoking, especially in the first few weeks. To manage this, you can:  Avoid parties, bars, and other social situations where people might be smoking.  Avoid alcohol.  Leave right away if you have the urge to smoke.  Explain to your family and friends that you are quitting smoking. Ask for understanding and support.  Plan activities with friends or family where smoking is not an option.  What are some ways I can cope with stress? Wanting to smoke may cause stress, and stress can make you want to smoke. Find ways to manage your stress. Relaxation techniques can help. For example:  Breathe slowly and deeply, in through your nose and out through your  mouth.  Listen to soothing, relaxing music.  Talk with a family member or friend about your stress.  Light a candle.  Soak in a bath or take a shower.  Think about a peaceful place.  What are some ways I can prevent weight gain? Be aware that many people gain weight after they quit smoking. However, not  everyone does. To keep from gaining weight, have a plan in place before you quit and stick to the plan after you quit. Your plan should include:  Having healthy snacks. When you have a craving, it may help to: ? Eat plain popcorn, crunchy carrots, celery, or other cut vegetables. ? Chew sugar-free gum.  Changing how you eat: ? Eat small portion sizes at meals. ? Eat 4-6 small meals throughout the day instead of 1-2 large meals a day. ? Be mindful when you eat. Do not watch television or do other things that might distract you as you eat.  Exercising regularly: ? Make time to exercise each day. If you do not have time for a long workout, do short bouts of exercise for 5-10 minutes several times a day. ? Do some form of strengthening exercise, like weight lifting, and some form of aerobic exercise, like running or swimming.  Drinking plenty of water or other low-calorie or no-calorie drinks. Drink 6-8 glasses of water daily, or as much as instructed by your health care provider.  Summary  Quitting smoking is a physical and mental challenge. You will face cravings, withdrawal symptoms, and temptation to smoke again. Preparation can help you as you go through these challenges.  You can cope with cravings by keeping your mouth busy (such as by chewing gum), keeping your body and hands busy, and making calls to family, friends, or a helpline for people who want to quit smoking.  You can cope with withdrawal symptoms by avoiding places where people smoke, avoiding drinks with caffeine, and getting plenty of rest.  Ask your health care provider about the different ways to prevent weight gain, avoid stress, and handle social situations. This Dale is not intended to replace advice given to you by your health care provider. Make sure you discuss any questions you have with your health care provider. Document Released: 06/08/2016 Document Revised: 06/08/2016 Document Reviewed:  06/08/2016 Elsevier Interactive Patient Education  2018 [place] you healthy  Get these tests  Butler pressure- Have your Butler pressure checked once a year by your healthcare provider.  Normal Butler pressure is 120/80  Weight- Have your body mass index (BMI) calculated to screen for obesity.  BMI is a measure of body fat based on height and weight. You can also calculate your own BMI at ViewBanking.si.  Cholesterol- Have your cholesterol checked every year.  Diabetes- Have your Butler sugar checked regularly if you have high Butler pressure, high cholesterol, have a family history of diabetes or if you are overweight.  Screening for Colon Cancer- Colonoscopy starting at age 36.  Screening may begin sooner depending on your family history and other health conditions. Follow up colonoscopy as directed by your Gastroenterologist.  Screening for Prostate Cancer- Both Butler work (PSA) and a rectal exam help screen for Prostate Cancer.  Screening begins at age 26 with African-American men and at age 77 with Caucasian men.  Screening may begin sooner depending on your family history.  Take these medicines  Aspirin- One aspirin daily can help prevent Heart disease and Stroke.  Flu shot- Every fall.  Tetanus- Every 10 years.  Zostavax- Once after the age of 37 to prevent Shingles.  Pneumonia shot- Once after the age of 54; if you are younger than 50, ask your healthcare provider if you need a Pneumonia shot.  Take these steps  Don't smoke- If you do smoke, talk to your doctor about quitting.  For tips on how to quit, go to www.smokefree.gov or call 1-800-QUIT-NOW.  Be physically active- Exercise 5 days a week for at least 30 minutes.  If you are not already physically active start slow and gradually work up to 30 minutes of moderate physical activity.  Examples of moderate activity include walking briskly, mowing the yard, dancing, swimming, bicycling, etc.  Eat  a healthy diet- Eat a variety of healthy food such as fruits, vegetables, low fat milk, low fat cheese, yogurt, lean meant, poultry, fish, beans, tofu, etc. For more Dale go to www.thenutritionsource.org  Drink alcohol in moderation- Limit alcohol intake to less than two drinks a day. Never drink and drive.  Dentist- Brush and floss twice daily; visit your dentist twice a year.  Depression- Your emotional health is as important as your physical health. If you're feeling down, or losing interest in things you would normally enjoy please talk to your healthcare provider.  Eye exam- Visit your eye doctor every year.  Safe sex- If you may be exposed to a sexually transmitted infection, use a condom.  Seat belts- Seat belts can save your life; always wear one.  Smoke/Carbon Monoxide detectors- These detectors need to be installed on the appropriate level of your home.  Replace batteries at least once a year.  Skin cancer- When out in the sun, cover up and use sunscreen 15 SPF or higher.  Violence- If anyone is threatening you, please tell your healthcare provider.  Living Will/ Health care power of attorney- Speak with your healthcare provider and family.   IF you received an x-Butler today, you will receive an invoice from [facility]. Please contact [facility] at [phone] with questions or concerns regarding your invoice.   IF you received labwork today, you will receive an invoice from [facility]. Please contact [facility] at [phone] with questions or concerns regarding your invoice.   Our billing staff will not be able to assist you with questions regarding bills from these companies.  You will be contacted with the lab results as soon as they are available. The fastest way to get your results is to activate your My Chart account. Instructions are located on the last page of this paperwork. If you have not heard from [place] regarding the results in 2 weeks, please  contact this office.    I personally performed the services described in this documentation, which was scribed in my presence. The recorded Dale has been reviewed and considered for accuracy and completeness, addended by me as needed, and agree with Dale above.  Signed,   Dale Ray, MD [facility].  09/18/17 9:46 PM

## 2017-09-17 LAB — COMPREHENSIVE METABOLIC PANEL
ALBUMIN: 4.9 g/dL (ref 3.5–5.5)
ALK PHOS: 76 IU/L (ref 39–117)
ALT: 21 IU/L (ref 0–44)
AST: 36 IU/L (ref 0–40)
Albumin/Globulin Ratio: 1.6 (ref 1.2–2.2)
BUN / CREAT RATIO: 10 (ref 9–20)
BUN: 11 mg/dL (ref 6–24)
Bilirubin Total: 0.4 mg/dL (ref 0.0–1.2)
CALCIUM: 9.7 mg/dL (ref 8.7–10.2)
CO2: 25 mmol/L (ref 20–29)
CREATININE: 1.13 mg/dL (ref 0.76–1.27)
Chloride: 101 mmol/L (ref 96–106)
GFR, EST AFRICAN AMERICAN: 86 mL/min/{1.73_m2} (ref >59)
GFR, EST NON AFRICAN AMERICAN: 74 mL/min/{1.73_m2} (ref >59)
GLOBULIN, TOTAL: 3 g/dL (ref 1.5–4.5)
Glucose: 90 mg/dL (ref 65–99)
Potassium: 4.7 mmol/L (ref 3.5–5.2)
SODIUM: 140 mmol/L (ref 134–144)
Total Protein: 7.9 g/dL (ref 6.0–8.5)

## 2017-09-17 LAB — LIPID PANEL
CHOL/HDL RATIO: 4 ratio (ref 0.0–5.0)
Cholesterol, Total: 227 mg/dL — ABNORMAL HIGH (ref 100–199)
HDL: 57 mg/dL (ref >39)
LDL CALC: 132 mg/dL — AB (ref 0–99)
Triglycerides: 189 mg/dL — ABNORMAL HIGH (ref 0–149)
VLDL Cholesterol Cal: 38 mg/dL (ref 5–40)

## 2017-09-17 LAB — PSA: Prostate Specific Ag, Serum: 0.6 ng/mL (ref 0.0–4.0)

## 2017-09-23 ENCOUNTER — Encounter: Payer: Self-pay | Admitting: *Deleted

## 2017-11-27 ENCOUNTER — Encounter: Payer: Self-pay | Admitting: Family Medicine

## 2018-08-11 DIAGNOSIS — B300 Keratoconjunctivitis due to adenovirus: Secondary | ICD-10-CM | POA: Diagnosis not present

## 2018-08-18 DIAGNOSIS — H1045 Other chronic allergic conjunctivitis: Secondary | ICD-10-CM | POA: Diagnosis not present

## 2018-09-05 DIAGNOSIS — H15001 Unspecified scleritis, right eye: Secondary | ICD-10-CM | POA: Diagnosis not present

## 2018-10-02 ENCOUNTER — Other Ambulatory Visit: Payer: Self-pay

## 2018-10-02 DIAGNOSIS — Z0000 Encounter for general adult medical examination without abnormal findings: Secondary | ICD-10-CM

## 2018-10-06 ENCOUNTER — Other Ambulatory Visit: Payer: Self-pay

## 2018-10-06 ENCOUNTER — Ambulatory Visit (INDEPENDENT_AMBULATORY_CARE_PROVIDER_SITE_OTHER): Payer: BLUE CROSS/BLUE SHIELD | Admitting: Family Medicine

## 2018-10-06 DIAGNOSIS — Z0000 Encounter for general adult medical examination without abnormal findings: Secondary | ICD-10-CM | POA: Diagnosis not present

## 2018-10-07 ENCOUNTER — Telehealth (INDEPENDENT_AMBULATORY_CARE_PROVIDER_SITE_OTHER): Payer: BLUE CROSS/BLUE SHIELD | Admitting: Family Medicine

## 2018-10-07 DIAGNOSIS — Z23 Encounter for immunization: Secondary | ICD-10-CM

## 2018-10-07 DIAGNOSIS — E785 Hyperlipidemia, unspecified: Secondary | ICD-10-CM

## 2018-10-07 DIAGNOSIS — Z1211 Encounter for screening for malignant neoplasm of colon: Secondary | ICD-10-CM

## 2018-10-07 DIAGNOSIS — F1010 Alcohol abuse, uncomplicated: Secondary | ICD-10-CM

## 2018-10-07 DIAGNOSIS — R0683 Snoring: Secondary | ICD-10-CM

## 2018-10-07 DIAGNOSIS — Z0000 Encounter for general adult medical examination without abnormal findings: Secondary | ICD-10-CM

## 2018-10-07 DIAGNOSIS — Z720 Tobacco use: Secondary | ICD-10-CM

## 2018-10-07 LAB — LIPID PANEL
Chol/HDL Ratio: 5.6 ratio — ABNORMAL HIGH (ref 0.0–5.0)
Cholesterol, Total: 241 mg/dL — ABNORMAL HIGH (ref 100–199)
HDL: 43 mg/dL (ref >39)
Triglycerides: 434 mg/dL — ABNORMAL HIGH (ref 0–149)

## 2018-10-07 LAB — CBC
Hematocrit: 45.8 % (ref 37.5–51.0)
Hemoglobin: 15.4 g/dL (ref 13.0–17.7)
MCH: 29.9 pg (ref 26.6–33.0)
MCHC: 33.6 g/dL (ref 31.5–35.7)
MCV: 89 fL (ref 79–97)
Platelets: 277 10*3/uL (ref 150–450)
RBC: 5.15 x10E6/uL (ref 4.14–5.80)
RDW: 13.8 % (ref 11.6–15.4)
WBC: 5.1 10*3/uL (ref 3.4–10.8)

## 2018-10-07 LAB — PSA: Prostate Specific Ag, Serum: 0.6 ng/mL (ref 0.0–4.0)

## 2018-10-07 LAB — TSH: TSH: 3.2 u[IU]/mL (ref 0.450–4.500)

## 2018-10-07 MED ORDER — ZOSTER VAC RECOMB ADJUVANTED 50 MCG/0.5ML IM SUSR: 0.5000 mL | Freq: Once | INTRAMUSCULAR | 1 refills | Status: AC

## 2018-10-07 NOTE — Progress Notes (Signed)
CC-CPE- Today's visit is for a physical,patient do have an issue with right shoulder as well but we have scheduled a visit in the future for the shoulder. Patient is aware just do let him slide this one in on you. Patient had labs done in office yesterday and results are in chart to go over with patient.

## 2018-10-07 NOTE — Progress Notes (Signed)
Virtual Visit via Telephone Note  I connected with Dale Butler on 10/07/18 at 9:11 AM by telephone and verified that I am speaking with the correct person using two identifiers.   I discussed the limitations, risks, security and privacy concerns of performing an evaluation and management service by telephone and the availability of in person appointments. I also discussed with the patient that there may be a patient responsible charge related to this service. The patient expressed understanding and agreed to proceed, consent obtained  Chief complaint: CPE  History of Present Illness:  Annual exam:  Ongoing shoulder issue - occasional stiffness over past year. Tingling at times for few seconds in the morning. For past month. No weakness. Plans separate visit to discuss.   Cancer screening: Colonoscopy: Discussed last year and referral was placed. Trouble scheduling, but agrees to repeat referral. Will be back in town in May.   Prostate: Normal last year, testing performed yesterday also normal at 0.6.  Immunization History  Administered Date(s) Administered  . Influenza, Seasonal, Injecte, Preservative Fre 08/06/2012  . Tdap 08/06/2012  Shingles vaccine: has not had Did not get flu shot past year.   Depression screen [facility] 2/9 10/07/2018 09/16/2017 11/04/2014  Decreased Interest 0 0 0  Down, Depressed, Hopeless 0 0 0  PHQ - 2 Score 0 0 0   Vision: regular screening, ongoing care, on pred forte gtts. Talking to them weekly.   Dental: ongoing care - cleaning before beginning of year.   Exercise: 20,000 steps per day - works in [employer].   Biometric testing 2 months ago - told blood pressure was high, but has checked at pharmacy and normal. Thinks was situational when BP checked.   BP Readings from Last 3 Encounters:  09/16/17 128/82  11/04/14 134/70  08/06/12 120/82    Hyperlipidemia:  Lab Results  Component Value Date   CHOL 241 (H) 10/06/2018   HDL 43  10/06/2018   LDLCALC Comment 10/06/2018   TRIG 434 (H) 10/06/2018   CHOLHDL 5.6 (H) 10/06/2018   Lab Results  Component Value Date   ALT 21 09/16/2017   AST 36 09/16/2017   ALKPHOS 76 09/16/2017   BILITOT 0.4 09/16/2017  Initial plan for continued exercise but watching diet, over-the-counter fish oil supplement and rechecking levels in 6 months.  Not fasting for bloodwork yesterday.   Snoring: discussed last year, recommend meeting with sleep specialist to see if testing needed for sleep apnea.  Naps after work at times only, denies daytime sleepiness.   Tobacco abuse: Handout given last year on information to help quit smoking.  Still smoking 4 cigarettes per day. Has slowed down smoking since last visit.   Alcohol: 16 ounce cans of beer - 3 per day. No problems with work, no DUI. Drinking at night.  Alcoholism in father, GF, uncle.      Patient Active Problem List   Diagnosis Date Noted  . GERD (gastroesophageal reflux disease) 12/26/2011  . Smoker 12/26/2011  . Chest pain 12/26/2011   Past Medical History:  Diagnosis Date  . Chest pain    No past surgical history on file. No Known Allergies Prior to Admission medications   Medication Sig Start Date End Date Taking? Authorizing Provider  prednisoLONE acetate (PRED FORTE) 1 % ophthalmic suspension 1 drop 5 (five) times daily.   Yes [provider]  ranitidine (ZANTAC) 150 MG tablet Take 150 mg by mouth 2 (two) times daily. If needed   Yes [provider]  Social History   Socioeconomic History  . Marital status: Married    Spouse name: Not on file  . Number of children: Not on file  . Years of education: Not on file  . Highest education level: Not on file  Occupational History  . Not on file  Social Needs  . Financial resource strain: Not on file  . Food insecurity:    Worry: Not on file    Inability: Not on file  . Transportation needs:    Medical: Not on file    Non-medical: Not on file   Tobacco Use  . Smoking status: Current Every Day Smoker    Packs/day: 0.25  . Smokeless tobacco: Never Used  Substance and Sexual Activity  . Alcohol use: No    Alcohol/week: 0.0 standard drinks  . Drug use: No  . Sexual activity: Not on file  Lifestyle  . Physical activity:    Days per week: Not on file    Minutes per session: Not on file  . Stress: Not on file  Relationships  . Social connections:    Talks on phone: Not on file    Gets together: Not on file    Attends religious service: Not on file    Active member of club or organization: Not on file    Attends meetings of clubs or organizations: Not on file    Relationship status: Not on file  . Intimate partner violence:    Fear of current or ex partner: Not on file    Emotionally abused: Not on file    Physically abused: Not on file    Forced sexual activity: Not on file  Other Topics Concern  . Not on file  Social History Narrative  . Not on file     Observations/Objective: Normal speech, no distress on phone.  Assessment and Plan: Annual physical exam  - -anticipatory guidance as below in AVS, screening labs above. Health maintenance items as above in HPI discussed/recommended as applicable.   Screen for colon cancer - Plan: Ambulatory referral to Gastroenterology  Need for shingles vaccine - Plan: Zoster Vaccine Adjuvanted [facility]) injection sent to pharmacy  Alcohol abuse  -Decreased use discussed.  Resource provided if difficulty cutting back.   Tobacco abuse  -Importance of cessation discussed, handout given, commended on cutting back.  Hyperlipidemia, unspecified hyperlipidemia type  -Unlikely reliable reading on recent lab work as not truly fasting.  Plans on repeat fasting blood work, then decide on statin need.  Snoring   -Denies daytime somnolence.  Did discuss option of meeting with sleep specialist, phone number provided.  No distress on phone. Speaking normally.   Follow Up  Instructions:  3-4 weeks for shoulder, fasting labs.    Patient Instructions    For shoulder issue, schedule a visit in next few weeks, and we can discuss that further. That may need to be in office to be able to perform an exam. If any weakness or worsening - be seen sooner.   Keep a record of your blood pressures outside of the office and bring them to the next office visit . If over 140/90 - please let me know.   Shingles vaccine sent to your pharmacy.   I will refer you to gastroenterologist again for colonoscopy.   Cholesterol was high at testing yesterday.  We need to check that fasting (8 hours).  Please return for appointment in next few weeks and fasting for 8 hours prior to that appointment. We can  evaluate shoulder at that time as well.   Here is number to sleep specialist to discuss snoring: [facility]  Phone: [phone]  There is also info below on quitting smoking. Let me know if I can help. Good job on cutting back.   I encourage you to cut back on alcohol, but if you need assistance for alcohol.    [facility]  Address: [address] [place], [place] [address]  Phone: [phone]  [facility]  Address: [address], [place], [place] [address]  Phone: [phone]  [facility] Address: [place], [place], [place] [address]  Phone: [phone]   Return to the clinic or go to the nearest emergency room if any of your symptoms worsen or new symptoms occur.   Steps to Quit Smoking  Smoking tobacco can be harmful to your health and can affect almost every organ in your body. Smoking puts you, and those around you, at risk for developing many serious chronic diseases. Quitting smoking is difficult, but it is one of the best things that you can do for your health. It is never too late to quit. What are the benefits of quitting smoking? When you quit smoking, you lower your risk of  developing serious diseases and conditions, such as:  Lung cancer or lung disease, such as COPD.  Heart disease.  Stroke.  Heart attack.  Infertility.  Osteoporosis and bone fractures. Additionally, symptoms such as coughing, wheezing, and shortness of breath may get better when you quit. You may also find that you get sick less often because your body is stronger at fighting off colds and infections. If you are pregnant, quitting smoking can help to reduce your chances of having a baby of low birth weight. How do I get ready to quit? When you decide to quit smoking, create a plan to make sure that you are successful. Before you quit:  Pick a date to quit. Set a date within the next two weeks to give you time to prepare.  Write down the reasons why you are quitting. Keep this list in places where you will see it often, such as on your bathroom mirror or in your car or wallet.  Identify the people, places, things, and activities that make you want to smoke (triggers) and avoid them. Make sure to take these actions: ? Throw away all cigarettes at home, at work, and in your car. ? Throw away smoking accessories, such as Scientist, research (medical). ? Clean your car and make sure to empty the ashtray. ? Clean your home, including curtains and carpets.  Tell your family, friends, and coworkers that you are quitting. Support from your loved ones can make quitting easier.  Talk with your health care provider about your options for quitting smoking.  Find out what treatment options are covered by your health insurance. What strategies can I use to quit smoking? Talk with your healthcare provider about different strategies to quit smoking. Some strategies include:  Quitting smoking altogether instead of gradually lessening how much you smoke over a period of time. Research shows that quitting "cold [place]" is more successful than gradually quitting.  Attending in-person counseling to help you  build problem-solving skills. You are more likely to have success in quitting if you attend several counseling sessions. Even short sessions of 10 minutes can be effective.  Finding resources and support systems that can help you to quit smoking and remain  smoke-free after you quit. These resources are most helpful when you use them often. They can include: ? Online chats with a Social worker. ? Telephone quitlines. ? Careers information officer. ? Support groups or group counseling. ? Text messaging programs. ? Mobile phone applications.  Taking medicines to help you quit smoking. (If you are pregnant or breastfeeding, talk with your health care provider first.) Some medicines contain nicotine and some do not. Both types of medicines help with cravings, but the medicines that include nicotine help to relieve withdrawal symptoms. Your health care provider may recommend: ? Nicotine patches, gum, or lozenges. ? Nicotine inhalers or sprays. ? Non-nicotine medicine that is taken by mouth. Talk with your health care provider about combining strategies, such as taking medicines while you are also receiving in-person counseling. Using these two strategies together makes you more likely to succeed in quitting than if you used either strategy on its own. If you are pregnant or breastfeeding, talk with your health care provider about finding counseling or other support strategies to quit smoking. Do not take medicine to help you quit smoking unless told to do so by your health care provider. What things can I do to make it easier to quit? Quitting smoking might feel overwhelming at first, but there is a lot that you can do to make it easier. Take these important actions:  Reach out to your family and friends and ask that they support and encourage you during this time. Call telephone quitlines, reach out to support groups, or work with a counselor for support.  Ask people who smoke to avoid smoking around  you.  Avoid places that trigger you to smoke, such as bars, parties, or smoke-break areas at work.  Spend time around people who do not smoke.  Lessen stress in your life, because stress can be a smoking trigger for some people. To lessen stress, try: ? Exercising regularly. ? Deep-breathing exercises. ? Yoga. ? Meditating. ? Performing a body scan. This involves closing your eyes, scanning your body from head to toe, and noticing which parts of your body are particularly tense. Purposefully relax the muscles in those areas.  Download or purchase mobile phone or tablet apps (applications) that can help you stick to your quit plan by providing reminders, tips, and encouragement. There are many free apps, such as QuitGuide from the [employer] Office manager for Disease Control and Prevention). You can find other support for quitting smoking (smoking cessation) through smokefree.gov and other websites. How will I feel when I quit smoking? Within the first 24 hours of quitting smoking, you may start to feel some withdrawal symptoms. These symptoms are usually most noticeable 2-3 days after quitting, but they usually do not last beyond 2-3 weeks. Changes or symptoms that you might experience include:  Mood swings.  Restlessness, anxiety, or irritation.  Difficulty concentrating.  Dizziness.  Strong cravings for sugary foods in addition to nicotine.  Mild weight gain.  Constipation.  Nausea.  Coughing or a sore throat.  Changes in how your medicines work in your body.  A depressed mood.  Difficulty sleeping (insomnia). After the first 2-3 weeks of quitting, you may start to notice more positive results, such as:  Improved sense of smell and taste.  Decreased coughing and sore throat.  Slower heart rate.  Lower blood pressure.  Clearer skin.  The ability to breathe more easily.  Fewer sick days. Quitting smoking is very challenging for most people. Do not get discouraged if you are  not successful the first time. Some people need to make many attempts to quit before they achieve long-term success. Do your best to stick to your quit plan, and talk with your health care provider if you have any questions or concerns. This information is not intended to replace advice given to you by your health care provider. Make sure you discuss any questions you have with your health care provider. Document Released: 06/05/2001 Document Revised: 01/15/2017 Document Reviewed: 10/26/2014 Elsevier Interactive Patient Education  2019 [place] you healthy  Get these tests  Blood pressure- Have your blood pressure checked once a year by your healthcare provider.  Normal blood pressure is 120/80  Weight- Have your body mass index (BMI) calculated to screen for obesity.  BMI is a measure of body fat based on height and weight. You can also calculate your own BMI at ViewBanking.si.  Cholesterol- Have your cholesterol checked every year.  Diabetes- Have your blood sugar checked regularly if you have high blood pressure, high cholesterol, have a family history of diabetes or if you are overweight.  Screening for Colon Cancer- Colonoscopy starting at age 23.  Screening may begin sooner depending on your family history and other health conditions. Follow up colonoscopy as directed by your Gastroenterologist.  Screening for Prostate Cancer- Both blood work (PSA) and a rectal exam help screen for Prostate Cancer.  Screening begins at age 64 with African-American men and at age 74 with Caucasian men.  Screening may begin sooner depending on your family history.  Take these medicines  Aspirin- One aspirin daily can help prevent Heart disease and Stroke.  Flu shot- Every fall.  Tetanus- Every 10 years.  Zostavax- Once after the age of 67 to prevent Shingles.  Pneumonia shot- Once after the age of 53; if you are younger than 21, ask your healthcare provider if you need  a Pneumonia shot.  Take these steps  Don't smoke- If you do smoke, talk to your doctor about quitting.  For tips on how to quit, go to www.smokefree.gov or call 1-800-QUIT-NOW.  Be physically active- Exercise 5 days a week for at least 30 minutes.  If you are not already physically active start slow and gradually work up to 30 minutes of moderate physical activity.  Examples of moderate activity include walking briskly, mowing the yard, dancing, swimming, bicycling, etc.  Eat a healthy diet- Eat a variety of healthy food such as fruits, vegetables, low fat milk, low fat cheese, yogurt, lean meant, poultry, fish, beans, tofu, etc. For more information go to www.thenutritionsource.org  Drink alcohol in moderation- Limit alcohol intake to less than two drinks a day. Never drink and drive.  Dentist- Brush and floss twice daily; visit your dentist twice a year.  Depression- Your emotional health is as important as your physical health. If you're feeling down, or losing interest in things you would normally enjoy please talk to your healthcare provider.  Eye exam- Visit your eye doctor every year.  Safe sex- If you may be exposed to a sexually transmitted infection, use a condom.  Seat belts- Seat belts can save your life; always wear one.  Smoke/Carbon Monoxide detectors- These detectors need to be installed on the appropriate level of your home.  Replace batteries at least once a year.  Skin cancer- When out in the sun, cover up and use sunscreen 15 SPF or higher.  Violence- If anyone is threatening you, please tell your healthcare provider.  Living Will/  Health care power of attorney- Speak with your healthcare provider and family.  If you have lab work done today you will be contacted with your lab results within the next 2 weeks.  If you have not heard from [place] then please contact us. The fastest way to get your results is to register for My Chart.   IF you received an x-ray today, you  will receive an invoice from [facility]. Please contact [facility] at [phone] with questions or concerns regarding your invoice.   IF you received labwork today, you will receive an invoice from [facility]. Please contact [facility] at [phone] with questions or concerns regarding your invoice.   Our billing staff will not be able to assist you with questions regarding bills from these companies.  You will be contacted with the lab results as soon as they are available. The fastest way to get your results is to activate your My Chart account. Instructions are located on the last page of this paperwork. If you have not heard from [place] regarding the results in 2 weeks, please contact this office.          I discussed the assessment and treatment plan with the patient. The patient was provided an opportunity to ask questions and all were answered. The patient agreed with the plan and demonstrated an understanding of the instructions.   The patient was advised to call back or seek an in-person evaluation if the symptoms worsen or if the condition fails to improve as anticipated.  I provided 27 minutes of non-face-to-face time during this encounter.  Signed,   Merri Ray, MD [facility].  10/07/18

## 2018-10-07 NOTE — Patient Instructions (Addendum)
For shoulder issue, schedule a visit in next few weeks, and we can discuss that further. That may need to be in office to be able to perform an exam. If any weakness or worsening - be seen sooner.   Keep a record of your blood pressures outside of the office and bring them to the next office visit . If over 140/90 - please let me know.   Shingles vaccine sent to your pharmacy.   I will refer you to gastroenterologist again for colonoscopy.   Cholesterol was high at testing yesterday.  We need to check that fasting (8 hours).  Please return for appointment in next few weeks and fasting for 8 hours prior to that appointment. We can evaluate shoulder at that time as well.   Here is number to sleep specialist to discuss snoring: [facility]  Phone: [phone]  There is also info below on quitting smoking. Let me know if I can help. Good job on cutting back.   I encourage you to cut back on alcohol, but if you need assistance for alcohol.    [facility]  Address: [address] [place], [place] [address]  Phone: [phone]  [facility]  Address: [address] [place], [place] [address]  Phone: [phone]  [facility] Address: [place], [place], [place] [address]  Phone: [phone]   Return to the clinic or go to the nearest emergency room if any of your symptoms worsen or new symptoms occur.   Steps to Quit Smoking  Smoking tobacco can be harmful to your health and can affect almost every organ in your body. Smoking puts you, and those around you, at risk for developing many serious chronic diseases. Quitting smoking is difficult, but it is one of the best things that you can do for your health. It is never too late to quit. What are the benefits of quitting smoking? When you quit smoking, you lower your risk of developing serious diseases and conditions, such as:  Lung cancer or lung disease, such  as COPD.  Heart disease.  Stroke.  Heart attack.  Infertility.  Osteoporosis and bone fractures. Additionally, symptoms such as coughing, wheezing, and shortness of breath may get better when you quit. You may also find that you get sick less often because your body is stronger at fighting off colds and infections. If you are pregnant, quitting smoking can help to reduce your chances of having a baby of low birth weight. How do I get ready to quit? When you decide to quit smoking, create a plan to make sure that you are successful. Before you quit:  Pick a date to quit. Set a date within the next two weeks to give you time to prepare.  Write down the reasons why you are quitting. Keep this list in places where you will see it often, such as on your bathroom mirror or in your car or wallet.  Identify the people, places, things, and activities that make you want to smoke (triggers) and avoid them. Make sure to take these actions: ? Throw away all cigarettes at home, at work, and in your car. ? Throw away smoking accessories, such as Scientist, research (medical). ? Clean your car and make sure to empty the ashtray. ? Clean your home, including curtains and carpets.  Tell your family, friends, and coworkers that you are quitting. Support from your loved ones can make quitting easier.  Talk with your health care provider about your options for quitting smoking.  Find out what treatment options are covered by your health insurance. What strategies can I use to quit smoking? Talk with your healthcare provider about different strategies to quit smoking. Some strategies include:  Quitting smoking altogether instead of gradually lessening how much you smoke over a period of time. Research shows that quitting "cold [place]" is more successful than gradually quitting.  Attending in-person counseling to help you build problem-solving skills. You are more likely to have success in quitting if you attend  several counseling sessions. Even short sessions of 10 minutes can be effective.  Finding resources and support systems that can help you to quit smoking and remain smoke-free after you quit. These resources are most helpful when you use them often. They can include: ? Online chats with a Social worker. ? Telephone quitlines. ? Careers information officer. ? Support groups or group counseling. ? Text messaging programs. ? Mobile phone applications.  Taking medicines to help you quit smoking. (If you are pregnant or breastfeeding, talk with your health care provider first.) Some medicines contain nicotine and some do not. Both types of medicines help with cravings, but the medicines that include nicotine help to relieve withdrawal symptoms. Your health care provider may recommend: ? Nicotine patches, gum, or lozenges. ? Nicotine inhalers or sprays. ? Non-nicotine medicine that is taken by mouth. Talk with your health care provider about combining strategies, such as taking medicines while you are also receiving in-person counseling. Using these two strategies together makes you more likely to succeed in quitting than if you used either strategy on its own. If you are pregnant or breastfeeding, talk with your health care provider about finding counseling or other support strategies to quit smoking. Do not take medicine to help you quit smoking unless told to do so by your health care provider. What things can I do to make it easier to quit? Quitting smoking might feel overwhelming at first, but there is a lot that you can do to make it easier. Take these important actions:  Reach out to your family and friends and ask that they support and encourage you during this time. Call telephone quitlines, reach out to support groups, or work with a counselor for support.  Ask people who smoke to avoid smoking around you.  Avoid places that trigger you to smoke, such as bars, parties, or smoke-break areas at  work.  Spend time around people who do not smoke.  Lessen stress in your life, because stress can be a smoking trigger for some people. To lessen stress, try: ? Exercising regularly. ? Deep-breathing exercises. ? Yoga. ? Meditating. ? Performing a body scan. This involves closing your eyes, scanning your body from head to toe, and noticing which parts of your body are particularly tense. Purposefully relax the muscles in those areas.  Download or purchase mobile phone or tablet apps (applications) that can help you stick to your quit plan by providing reminders, tips, and encouragement. There are many free apps, such as QuitGuide from the [employer] Office manager for Disease Control and Prevention). You can find other support for quitting smoking (smoking cessation) through smokefree.gov and other websites. How will I feel when I quit smoking? Within the first 24 hours of quitting smoking, you may start to feel some withdrawal symptoms. These symptoms are usually most noticeable 2-3 days after quitting, but they usually do not last beyond 2-3 weeks. Changes or symptoms that you might  experience include:  Mood swings.  Restlessness, anxiety, or irritation.  Difficulty concentrating.  Dizziness.  Strong cravings for sugary foods in addition to nicotine.  Mild weight gain.  Constipation.  Nausea.  Coughing or a sore throat.  Changes in how your medicines work in your body.  A depressed mood.  Difficulty sleeping (insomnia). After the first 2-3 weeks of quitting, you may start to notice more positive results, such as:  Improved sense of smell and taste.  Decreased coughing and sore throat.  Slower heart rate.  Lower blood pressure.  Clearer skin.  The ability to breathe more easily.  Fewer sick days. Quitting smoking is very challenging for most people. Do not get discouraged if you are not successful the first time. Some people need to make many attempts to quit before they  achieve long-term success. Do your best to stick to your quit plan, and talk with your health care provider if you have any questions or concerns. This information is not intended to replace advice given to you by your health care provider. Make sure you discuss any questions you have with your health care provider. Document Released: 06/05/2001 Document Revised: 01/15/2017 Document Reviewed: 10/26/2014 Elsevier Interactive Patient Education  2019 [place] you healthy  Get these tests  Blood pressure- Have your blood pressure checked once a year by your healthcare provider.  Normal blood pressure is 120/80  Weight- Have your body mass index (BMI) calculated to screen for obesity.  BMI is a measure of body fat based on height and weight. You can also calculate your own BMI at ViewBanking.si.  Cholesterol- Have your cholesterol checked every year.  Diabetes- Have your blood sugar checked regularly if you have high blood pressure, high cholesterol, have a family history of diabetes or if you are overweight.  Screening for Colon Cancer- Colonoscopy starting at age 54.  Screening may begin sooner depending on your family history and other health conditions. Follow up colonoscopy as directed by your Gastroenterologist.  Screening for Prostate Cancer- Both blood work (PSA) and a rectal exam help screen for Prostate Cancer.  Screening begins at age 56 with African-American men and at age 48 with Caucasian men.  Screening may begin sooner depending on your family history.  Take these medicines  Aspirin- One aspirin daily can help prevent Heart disease and Stroke.  Flu shot- Every fall.  Tetanus- Every 10 years.  Zostavax- Once after the age of 95 to prevent Shingles.  Pneumonia shot- Once after the age of 85; if you are younger than 18, ask your healthcare provider if you need a Pneumonia shot.  Take these steps  Don't smoke- If you do smoke, talk to your doctor  about quitting.  For tips on how to quit, go to www.smokefree.gov or call 1-800-QUIT-NOW.  Be physically active- Exercise 5 days a week for at least 30 minutes.  If you are not already physically active start slow and gradually work up to 30 minutes of moderate physical activity.  Examples of moderate activity include walking briskly, mowing the yard, dancing, swimming, bicycling, etc.  Eat a healthy diet- Eat a variety of healthy food such as fruits, vegetables, low fat milk, low fat cheese, yogurt, lean meant, poultry, fish, beans, tofu, etc. For more information go to www.thenutritionsource.org  Drink alcohol in moderation- Limit alcohol intake to less than two drinks a day. Never drink and drive.  Dentist- Brush and floss twice daily; visit your dentist twice a year.  Depression- Your emotional health is as important as your physical health. If you're feeling down, or losing interest in things you would normally enjoy please talk to your healthcare provider.  Eye exam- Visit your eye doctor every year.  Safe sex- If you may be exposed to a sexually transmitted infection, use a condom.  Seat belts- Seat belts can save your life; always wear one.  Smoke/Carbon Monoxide detectors- These detectors need to be installed on the appropriate level of your home.  Replace batteries at least once a year.  Skin cancer- When out in the sun, cover up and use sunscreen 15 SPF or higher.  Violence- If anyone is threatening you, please tell your healthcare provider.  Living Will/ Health care power of attorney- Speak with your healthcare provider and family.  If you have lab work done today you will be contacted with your lab results within the next 2 weeks.  If you have not heard from [place] then please contact us. The fastest way to get your results is to register for My Chart.   IF you received an x-ray today, you will receive an invoice from [facility]. Please contact [facility] at  [phone] with questions or concerns regarding your invoice.   IF you received labwork today, you will receive an invoice from [facility]. Please contact [facility] at [phone] with questions or concerns regarding your invoice.   Our billing staff will not be able to assist you with questions regarding bills from these companies.  You will be contacted with the lab results as soon as they are available. The fastest way to get your results is to activate your My Chart account. Instructions are located on the last page of this paperwork. If you have not heard from [place] regarding the results in 2 weeks, please contact this office.

## 2018-10-13 ENCOUNTER — Telehealth: Payer: BLUE CROSS/BLUE SHIELD | Admitting: Family Medicine

## 2018-10-28 ENCOUNTER — Other Ambulatory Visit: Payer: Self-pay

## 2018-10-28 ENCOUNTER — Telehealth (INDEPENDENT_AMBULATORY_CARE_PROVIDER_SITE_OTHER): Payer: BLUE CROSS/BLUE SHIELD | Admitting: Family Medicine

## 2018-10-28 DIAGNOSIS — R202 Paresthesia of skin: Secondary | ICD-10-CM | POA: Diagnosis not present

## 2018-10-28 DIAGNOSIS — R0683 Snoring: Secondary | ICD-10-CM

## 2018-10-28 MED ORDER — MELOXICAM 7.5 MG PO TABS: 7.5000 mg | ORAL_TABLET | Freq: Every day | ORAL | 0 refills | Status: DC

## 2018-10-28 NOTE — Progress Notes (Signed)
CC-Tingling in the right shoulder/sleep study- tingling in right shoulder for 3 month, no pain, no known injury. Have not tried otc for the pain. Trouble sleeping for 9 months, snoring, wife said seems like I stop breathing at night along with the snoring. Need a referral to have a sleep study.

## 2018-10-28 NOTE — Patient Instructions (Addendum)
  Tylenol if needed for shoulder, gentle range of motion of neck during the day. Meloxicam if needed once per day. If shoulder not improving in next 3-4 weeks, return for office visit and exam.   Here is the number for sleep specialist where we referred you last year.  You should be able to call and schedule an appointment without a new referral, but please let me know if they require a new referral to be placed. [facility]  Phone: [phone]  Good talking to you today, take care.    If you have lab work done today you will be contacted with your lab results within the next 2 weeks.  If you have not heard from [place] then please contact us. The fastest way to get your results is to register for My Chart.   IF you received an x-ray today, you will receive an invoice from [facility]. Please contact [facility] at [phone] with questions or concerns regarding your invoice.   IF you received labwork today, you will receive an invoice from [facility]. Please contact [facility] at [phone] with questions or concerns regarding your invoice.   Our billing staff will not be able to assist you with questions regarding bills from these companies.  You will be contacted with the lab results as soon as they are available. The fastest way to get your results is to activate your My Chart account. Instructions are located on the last page of this paperwork. If you have not heard from [place] regarding the results in 2 weeks, please contact this office.

## 2018-10-28 NOTE — Progress Notes (Signed)
Virtual Visit via Telephone Note  I connected with Dale Butler on 10/28/18 at 8:18 AM by telephone and verified that I am speaking with the correct person using two identifiers.   I discussed the limitations, risks, security and privacy concerns of performing an evaluation and management service by telephone and the availability of in person appointments. I also discussed with the patient that there may be a patient responsible charge related to this service. The patient expressed understanding and agreed to proceed, consent obtained  Chief complaint: R shoulder, and sleep issue.   History of Present Illness:  Right shoulder discomfort: Has noticed tingling in the right shoulder for the past 3-4 months, no known injury. Happens in morning and evening. Notes with driving.  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 Able to move and use shoulder normally. No weakness.  No specific treatments- occasional tylenol or advil. Better last 2 days.   He was treated for left shoulder pain in 2016, pinching sensation at that time radiating to the back of his shoulder.  Treated with meloxicam at that time.   Sleep difficulties: Snoring was discussed last year, recommend immediate with sleep specialist at that time.  He was napping after work at times.  Was referred to sleep specialist September 16, 2017.  Phone number was provided last visit for him to call and schedule given continued concerns.  Does report that his wife has noted him snoring as well as stopping breathing at times.  Notes more symptoms past 9 months. No daytime sleepiness. More snoring than usual, with pauses at times.    Patient Active Problem List   Diagnosis Date Noted  . GERD (gastroesophageal reflux disease) 12/26/2011  . Smoker 12/26/2011  . Chest pain 12/26/2011   Past Medical History:  Diagnosis Date  . Chest pain    No past surgical history on file. No Known Allergies Prior to Admission  medications   Medication Sig Start Date End Date Taking? Authorizing Provider  prednisoLONE acetate (PRED FORTE) 1 % ophthalmic suspension 1 drop 5 (five) times daily.    [provider]  ranitidine (ZANTAC) 150 MG tablet Take 150 mg by mouth 2 (two) times daily. If needed    [provider]   Social History   Socioeconomic History  . Marital status: Married    Spouse name: Not on file  . Number of children: Not on file  . Years of education: Not on file  . Highest education level: Not on file  Occupational History  . Not on file  Social Needs  . Financial resource strain: Not on file  . Food insecurity:    Worry: Not on file    Inability: Not on file  . Transportation needs:    Medical: Not on file    Non-medical: Not on file  Tobacco Use  . Smoking status: Current Every Day Smoker    Packs/day: 0.25  . Smokeless tobacco: Never Used  Substance and Sexual Activity  . Alcohol use: No    Alcohol/week: 0.0 standard drinks  . Drug use: No  . Sexual activity: Not on file  Lifestyle  . Physical activity:    Days per week: Not on file    Minutes per session: Not on file  . Stress: Not on file  Relationships  . Social connections:    Talks on phone: Not on file    Gets together: Not on file    Attends religious service: Not on file  Active member of club or organization: Not on file    Attends meetings of clubs or organizations: Not on file    Relationship status: Not on file  . Intimate partner violence:    Fear of current or ex partner: Not on file    Emotionally abused: Not on file    Physically abused: Not on file    Forced sexual activity: Not on file  Other Topics Concern  . Not on file  Social History Narrative  . Not on file     Observations/Objective: No distress. Appropriate responses.   Assessment and Plan: Tingling of right upper extremity  -Possibly positional with driving, differential includes degenerative disc disease of neck  with secondary pinched nerve/spasm.  Denies weakness or difficulty with use of shoulder/arm.  -Initial trial of over-the-counter Tylenol, episodic meloxicam as needed with recheck in office for exam in 3 to 4 weeks if not improving, sooner if worse  Snoring  -Increased snoring past 9 months with some reported pauses.  Suspicious for obstructive sleep apnea.  - referral was placed last year but has not yet met with sleep specialist.  Phone number again provided and advised to let me know if new referral is needed.  Advised to not drive if at all sleepy.  Follow Up Instructions:  as needed.  Patient Instructions    Tylenol if needed for shoulder, gentle range of motion of neck during the day. Meloxicam if needed once per day. If shoulder not improving in next 3-4 weeks, return for office visit and exam.   Here is the number for sleep specialist where we referred you last year.  You should be able to call and schedule an appointment without a new referral, but please let me know if they require a new referral to be placed. [facility]  Phone: [phone]  Good talking to you today, take care.    If you have lab work done today you will be contacted with your lab results within the next 2 weeks.  If you have not heard from [place] then please contact us. The fastest way to get your results is to register for My Chart.   IF you received an x-ray today, you will receive an invoice from [facility]. Please contact [facility] at [phone] with questions or concerns regarding your invoice.   IF you received labwork today, you will receive an invoice from [facility]. Please contact [facility] at [phone] with questions or concerns regarding your invoice.   Our billing staff will not be able to assist you with questions regarding bills from these companies.  You will be contacted with the lab results as soon as they are available. The  fastest way to get your results is to activate your My Chart account. Instructions are located on the last page of this paperwork. If you have not heard from [place] regarding the results in 2 weeks, please contact this office.           I discussed the assessment and treatment plan with the patient. The patient was provided an opportunity to ask questions and all were answered. The patient agreed with the plan and demonstrated an understanding of the instructions.   The patient was advised to call back or seek an in-person evaluation if the symptoms worsen or if the condition fails to improve as anticipated.  I provided 10 minutes of non-face-to-face time during this encounter.  Signed,   Merri Ray, MD [facility]  at [place].  10/28/18

## 2018-12-08 ENCOUNTER — Encounter: Payer: Self-pay | Admitting: Gastroenterology

## 2018-12-15 ENCOUNTER — Telehealth: Payer: Self-pay | Admitting: Family Medicine

## 2018-12-15 NOTE — Telephone Encounter (Signed)
Copied from [place] [id number]. Topic: General - Inquiry >> Dec 15, 2018  3:31 PM Richardo Priest, NT wrote: Reason for CRM: Patient is requesting that an order for COVID-19 be placed for him and his wife. Patient states he has no symptoms but is getting worried with more cases increasing. Call back is [phone] or [phone].

## 2018-12-16 NOTE — Telephone Encounter (Signed)
Patient was informed to have wife give our office a call and schedule a telemed visit with Dr Carlota Raspberry to see if patient meets the criteria to be tested for covid

## 2018-12-22 ENCOUNTER — Encounter: Payer: BLUE CROSS/BLUE SHIELD | Admitting: Family Medicine

## 2018-12-22 NOTE — Progress Notes (Signed)
   Virtual Visit via Telephone Note  I called  Dale Butler on 12/22/18 at 1:31 PM - no answer, left VM. No answer at 1:39 PM, no answer at 2:11 PM.  Left VM to call back to reschedule.   Chief complaint: Concerns regarding covid testing.    Signed,   Merri Ray, MD [facility] at [place].  12/22/18

## 2018-12-22 NOTE — Progress Notes (Signed)
CC: Patient states he would like to get tested for COVID-19. He has not had any symptoms or been exposed to anyone with COVID-19.

## 2019-01-02 ENCOUNTER — Other Ambulatory Visit: Payer: Self-pay

## 2019-01-02 ENCOUNTER — Ambulatory Visit (AMBULATORY_SURGERY_CENTER): Payer: Self-pay | Admitting: *Deleted

## 2019-01-02 VITALS — Ht 71.0 in | Wt 190.0 lb

## 2019-01-02 DIAGNOSIS — Z1211 Encounter for screening for malignant neoplasm of colon: Secondary | ICD-10-CM

## 2019-01-02 MED ORDER — SUPREP BOWEL PREP KIT 17.5-3.13-1.6 GM/177ML PO SOLN: ORAL | 0 refills | Status: DC

## 2019-01-02 NOTE — Progress Notes (Signed)
Pt's previsit is done over the phone and all paperwork (prep instructions, blank consent form to just read over, pre-procedure acknowledgement form and stamped envelope) sent to patient  No trouble moving neck  No diet medications taken  No home oxygen used or hx of sleep apnea  No egg or soy allergy  $15 off Suprep coupon given  Pt is aware that care partner will wait in the car during procedure; if they feel like they will be too hot to wait in the car; they may wait in the lobby.  We want them to wear a mask (we do not have any that we can provide them), practice social distancing, and we will check their temperatures when they get here.  I did remind patient that their care partner needs to stay in the parking lot the entire time. Pt will wear mask into building.

## 2019-01-13 ENCOUNTER — Telehealth: Payer: Self-pay | Admitting: Gastroenterology

## 2019-01-13 NOTE — Telephone Encounter (Signed)
No to all answers. °

## 2019-01-13 NOTE — Telephone Encounter (Signed)

## 2019-01-14 ENCOUNTER — Ambulatory Visit (AMBULATORY_SURGERY_CENTER): Payer: BC Managed Care – PPO | Admitting: Gastroenterology

## 2019-01-14 ENCOUNTER — Encounter: Payer: Self-pay | Admitting: Gastroenterology

## 2019-01-14 ENCOUNTER — Other Ambulatory Visit: Payer: Self-pay

## 2019-01-14 VITALS — BP 124/76 | HR 61 | Temp 98.0°F | Resp 13 | Ht 71.0 in | Wt 190.0 lb

## 2019-01-14 DIAGNOSIS — K635 Polyp of colon: Secondary | ICD-10-CM

## 2019-01-14 DIAGNOSIS — Z1211 Encounter for screening for malignant neoplasm of colon: Secondary | ICD-10-CM | POA: Diagnosis not present

## 2019-01-14 DIAGNOSIS — D122 Benign neoplasm of ascending colon: Secondary | ICD-10-CM

## 2019-01-14 MED ORDER — SODIUM CHLORIDE 0.9 % IV SOLN: 500.0000 mL | Freq: Once | INTRAVENOUS | Status: AC

## 2019-01-14 NOTE — Op Note (Signed)
[place] Patient Name: Dale Butler Procedure Date: 01/14/2019 9:31 AM MRN: [id number] Endoscopist: Thornton Park MD, MD Age: 53 Referring MD:  Date of Birth: [birth year] Gender: Male Account #: [id number] Procedure:                Colonoscopy Indications:              Screening for colorectal malignant neoplasm, This                            is the patient's first colonoscopy                           No known family history of colon cancer or polyps Medicines:                See the Anesthesia note for documentation of the                            administered medications Procedure:                Pre-Anesthesia Assessment:                           - Prior to the procedure, a History and Physical                            was performed, and patient medications and                            allergies were reviewed. The patient's tolerance of                            previous anesthesia was also reviewed. The risks                            and benefits of the procedure and the sedation                            options and risks were discussed with the patient.                            All questions were answered, and informed consent                            was obtained. Prior Anticoagulants: The patient has                            taken no previous anticoagulant or antiplatelet                            agents. ASA Grade Assessment: II - A patient with                            mild systemic disease. After reviewing the risks  and benefits, the patient was deemed in                            satisfactory condition to undergo the procedure.                           After obtaining informed consent, the colonoscope                            was passed under direct vision. Throughout the                            procedure, the patient's blood pressure, pulse, and                            oxygen saturations were  monitored continuously. The                            Colonoscope was introduced through the anus and                            advanced to the the terminal ileum, with                            identification of the appendiceal orifice and IC                            valve. A second forward view of the right colon was                            performed. The colonoscopy was performed without                            difficulty. The patient tolerated the procedure                            well. The quality of the bowel preparation was                            good. The terminal ileum, ileocecal valve,                            appendiceal orifice, and rectum were photographed. Scope In: 9:43:00 AM Scope Out: 9:55:35 AM Scope Withdrawal Time: 0 hours 10 minutes 46 seconds  Total Procedure Duration: 0 hours 12 minutes 35 seconds  Findings:                 The perianal and digital rectal examinations were                            normal.                           A 2 mm polyp was found in the ascending colon. The  polyp was sessile. The polyp was removed with a                            piecemeal technique using a cold biopsy forceps.                            Resection and retrieval were complete. Estimated                            blood loss was minimal.                           A few small and large-mouthed diverticula were                            found in the sigmoid colon.                           Non-bleeding internal hemorrhoids were found.                           The exam was otherwise without abnormality on                            direct and retroflexion views. Complications:            No immediate complications. Estimated blood loss:                            Minimal. Estimated Blood Loss:     Estimated blood loss was minimal. Estimated blood                            loss was minimal. Impression:               - One 2 mm  polyp in the ascending colon, removed                            piecemeal using a cold biopsy forceps. Resected and                            retrieved.                           - Diverticulosis in the sigmoid colon.                           - The examination was otherwise normal on direct                            and retroflexion views. Recommendation:           - Patient has a contact number available for                            emergencies. The signs and symptoms of potential  delayed complications were discussed with the                            patient. Return to normal activities tomorrow.                            Written discharge instructions were provided to the                            patient.                           - High fiber diet today.                           - Continue present medications.                           - Await pathology results.                           - Repeat colonoscopy in 7 years for surveillance if                            the polyp is an adenoma. If the polyp is benign,                            continue with routine colon cancer screening.                            Current guidelines suggest another colonoscopy in                            10 years, or earlier with any new symptoms. Thornton Park MD, MD 01/14/2019 10:02:05 AM This report has been signed electronically.

## 2019-01-14 NOTE — Progress Notes (Signed)
Called to room to assist during endoscopic procedure.  Patient ID and intended procedure confirmed with present staff. Received instructions for my participation in the procedure from the performing physician.  

## 2019-01-14 NOTE — Progress Notes (Signed)
To PACU, VSS. Report to [email or web address] 

## 2019-01-14 NOTE — Patient Instructions (Signed)
YOU HAD AN ENDOSCOPIC PROCEDURE TODAY AT [facility]:   Refer to the procedure report that was given to you for any specific questions about what was found during the examination.  If the procedure report does not answer your questions, please call your gastroenterologist to clarify.  If you requested that your care partner not be given the details of your procedure findings, then the procedure report has been included in a sealed envelope for you to review at your convenience later.  YOU SHOULD EXPECT: Some feelings of bloating in the abdomen. Passage of more gas than usual.  Walking can help get rid of the air that was put into your GI tract during the procedure and reduce the bloating. If you had a lower endoscopy (such as a colonoscopy or flexible sigmoidoscopy) you may notice spotting of blood in your stool or on the toilet paper. If you underwent a bowel prep for your procedure, you may not have a normal bowel movement for a few days.  Please Note:  You might notice some irritation and congestion in your nose or some drainage.  This is from the oxygen used during your procedure.  There is no need for concern and it should clear up in a day or so.  SYMPTOMS TO REPORT IMMEDIATELY:   Following lower endoscopy (colonoscopy or flexible sigmoidoscopy):  Excessive amounts of blood in the stool  Significant tenderness or worsening of abdominal pains  Swelling of the abdomen that is new, acute  Fever of 100F or higher  Please see handouts given to you on Polyps, Diverticulosis and Hemorrhoids.  For urgent or emergent issues, a gastroenterologist can be reached at any hour by calling [phone].   DIET:  We do recommend a small meal at first, but then you may proceed to your regular diet.  Drink plenty of fluids but you should avoid alcoholic beverages for 24 hours.  ACTIVITY:  You should plan to take it easy for the rest of today and you should NOT DRIVE or use heavy  machinery until tomorrow (because of the sedation medicines used during the test).    FOLLOW UP: Our staff will call the number listed on your records 48-72 hours following your procedure to check on you and address any questions or concerns that you may have regarding the information given to you following your procedure. If we do not reach you, we will leave a message.  We will attempt to reach you two times.  During this call, we will ask if you have developed any symptoms of COVID 19. If you develop any symptoms (ie: fever, flu-like symptoms, shortness of breath, cough etc.) before then, please call [phone].  If you test positive for Covid 19 in the 2 weeks post procedure, please call and report this information to [place].    If any biopsies were taken you will be contacted by phone or by letter within the next 1-3 weeks.  Please call us at [phone] if you have not heard about the biopsies in 3 weeks.    SIGNATURES/CONFIDENTIALITY: You and/or your care partner have signed paperwork which will be entered into your electronic medical record.  These signatures attest to the fact that that the information above on your After Visit Summary has been reviewed and is understood.  Full responsibility of the confidentiality of this discharge information lies with you and/or your care-partner.  Thank you for letting us take care of your healthcare needs today.

## 2019-01-16 ENCOUNTER — Telehealth: Payer: Self-pay | Admitting: *Deleted

## 2019-01-16 ENCOUNTER — Encounter: Payer: Self-pay | Admitting: Gastroenterology

## 2019-01-16 NOTE — Telephone Encounter (Signed)
  Follow up Call-  Call back number 01/14/2019  Post procedure Call Back phone  # [phone]  Permission to leave phone message Yes  Some recent data might be hidden     Patient questions:  Do you have a fever, pain , or abdominal swelling? No. Pain Score  0 *  Have you tolerated food without any problems? Yes.    Have you been able to return to your normal activities? Yes.    Do you have any questions about your discharge instructions: Diet   No. Medications  No. Follow up visit  No.  Do you have questions or concerns about your Care? No.  Actions: * If pain score is 4 or above: No action needed, pain <4.  1. Have you developed a fever since your procedure? NO  2.   Have you had an respiratory symptoms (SOB or cough) since your procedure? NO  3.   Have you tested positive for COVID 19 since your procedure NO  4.   Have you had any family members/close contacts diagnosed with the COVID 19 since your procedure?  NO   If yes to any of these questions please route to Joylene John, RN and Alphonsa Gin, RN.

## 2020-03-16 ENCOUNTER — Other Ambulatory Visit: Payer: Self-pay

## 2020-03-16 ENCOUNTER — Encounter: Payer: Self-pay | Admitting: Family Medicine

## 2020-03-16 ENCOUNTER — Ambulatory Visit (INDEPENDENT_AMBULATORY_CARE_PROVIDER_SITE_OTHER): Payer: BC Managed Care – PPO | Admitting: Family Medicine

## 2020-03-16 VITALS — BP 140/78 | HR 99 | Temp 99.0°F | Ht 71.0 in | Wt 194.8 lb

## 2020-03-16 DIAGNOSIS — H938X1 Other specified disorders of right ear: Secondary | ICD-10-CM | POA: Diagnosis not present

## 2020-03-16 NOTE — Patient Instructions (Addendum)
  I am glad to hear that the shoulder symptoms have improved.  There was a slight abnormality on the one side of your ear canal, potentially could be some wax but I would like to recheck that area in the next few weeks.  The remainder of your ear canal and eardrum looks good.  If any return of difficulty hearing, or the ear feels blocked, recheck sooner.   Thank you for coming in today.   If you have lab work done today you will be contacted with your lab results within the next 2 weeks.  If you have not heard from [place] then please contact us. The fastest way to get your results is to register for My Chart.   IF you received an x-ray today, you will receive an invoice from [facility]. Please contact [facility] at [phone] with questions or concerns regarding your invoice.   IF you received labwork today, you will receive an invoice from [facility]. Please contact [facility] at [phone] with questions or concerns regarding your invoice.   Our billing staff will not be able to assist you with questions regarding bills from these companies.  You will be contacted with the lab results as soon as they are available. The fastest way to get your results is to activate your My Chart account. Instructions are located on the last page of this paperwork. If you have not heard from [place] regarding the results in 2 weeks, please contact this office.

## 2020-03-16 NOTE — Progress Notes (Signed)
Subjective:  Patient ID: Dale Butler, male    DOB: 1966/01/03  Age: 54 y.o. MRN: [id number]  CC:  Chief Complaint  Patient presents with   right ear fullness.    unable to hear well. 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 More wax recently. Min past 2 weeks. More fullness, felt blocked for about a month, no pain. Called to schedule appt.  Less blocked past few weeks, less wax removed. Hearing normally past 2 weeks.   At end of visit did mention he had some previous shoulder pain Improved with meloxicam.  Occasional soreness with driving long distances, plans to discuss further at his follow-up visit.  Has been getting some relief with over-the-counter NSAIDs on occasion only.   History Patient Active Problem List   Diagnosis Date Noted   GERD (gastroesophageal reflux disease) 12/26/2011   Smoker 12/26/2011   Chest pain 12/26/2011   Past Medical History:  Diagnosis Date   Chest pain    GERD (gastroesophageal reflux disease)    History reviewed. No pertinent surgical history. No Known Allergies Prior to Admission medications   Medication Sig Start Date End Date Taking? Authorizing Provider  prednisoLONE acetate (PRED FORTE) 1 % ophthalmic suspension 1 drop 5 (five) times daily.   Yes [provider]  ranitidine (ZANTAC) 150 MG tablet Take 150 mg by mouth 2 (two) times daily. If needed   Yes [provider]  meloxicam (MOBIC) 7.5 MG tablet Take 1 tablet (7.5 mg total) by mouth daily. Patient not taking: Reported on 03/16/2020 10/28/18   Wendie Agreste, MD   Social History   Socioeconomic History   Marital status: Married    Spouse name: Not on file   Number of children: Not on file   Years of education: Not on file   Highest education level: Not on file  Occupational History   Not on file  Tobacco Use   Smoking  status: Current Every Day Smoker    Packs/day: 0.25   Smokeless tobacco: Never Used  Vaping Use   Vaping Use: Never used  Substance and Sexual Activity   Alcohol use: Yes    Alcohol/week: 0.0 standard drinks    Comment: daily- beer   Drug use: No   Sexual activity: Not on file  Other Topics Concern   Not on file  Social History Narrative   Not on file   Social Determinants of Health   Financial Resource Strain:    Difficulty of Paying Living Expenses: Not on file  Food Insecurity:    Worried About [place] in the Last Year: Not on file   Ran Out of Food in the Last Year: Not on file  Transportation Needs:    Lack of Transportation (Medical): Not on file   Lack of Transportation (Non-Medical): Not on file  Physical Activity:    Days of Exercise per Week: Not on file   Minutes of Exercise per Session: Not on file  Stress:    Feeling of Stress : Not on file  Social Connections:    Frequency of Communication with Friends and Family: Not on file   Frequency of Social Gatherings with Friends and Family: Not on file   Attends Religious Services: Not on file   Active Member of Clubs or Organizations: Not on file   Attends Club or  Organization Meetings: Not on file   Marital Status: Not on file  Intimate Partner Violence:    Fear of Current or Ex-Partner: Not on file   Emotionally Abused: Not on file   Physically Abused: Not on file   Sexually Abused: Not on file    Review of Systems  No fever/chills.  Able to hear normally out of right ear at this time.  No face pain, no swelling, no discharge, other per HPI. Objective:   Vitals:   03/16/20 1531 03/16/20 1538  BP: (!) 156/81 140/78  Pulse: 99   Temp: 99 F (37.2 C)   TempSrc: Temporal   SpO2: 97%   Weight: 194 lb 12.8 oz (88.4 kg)   Height: 5\' 11"  (1.803 m)      Physical Exam Constitutional:      General: He is not in acute distress.    Appearance: He is well-developed.    HENT:     Head: Normocephalic and atraumatic.     Ears:     Comments: Left ear, pinna nontender, canal clear, TM pearly gray  Right ear, pinna nontender, no mastoid tenderness, no swelling, no periauricular lymphadenopathy.  Minimal cerumen at base of canal, abnormality at approximately 7-9 o'clock, slightly elevated from canal wall, yellowish appearance, no active discharge, no bleeding or hematoma appreciated.  Canal otherwise clear, TM pearly gray without sign of rupture.  Hearing grossly intact and equal bilaterally. Cardiovascular:     Rate and Rhythm: Normal rate.  Pulmonary:     Effort: Pulmonary effort is normal.  Neurological:     Mental Status: He is alert and oriented to person, place, and time.        Assessment & Plan:  Dale Butler is a 54 y.o. male . Ear fullness, right Previous ear fullness potentially could have been cerumen impaction as it seemed to clear once he removed wax with home treatment.  Appears to have a slight abnormality on the posterior wall of the canal, potentially cerumen, less likely blister/wound as not painful.  Would like to recheck that area in the next 3 weeks for resolution.  Return sooner if fullness/change in hearing symptoms return.  As above he did note at the end of the visit that he did have some intermittent left shoulder pain, previously treated with meloxicam.  Minimal symptoms now with driving, plans to discuss further at next office visit but will continue over-the-counter NSAID intermittently for now.  No orders of the defined types were placed in this encounter.  Patient Instructions    I am glad to hear that the shoulder symptoms have improved.  There was a slight abnormality on the one side of your ear canal, potentially could be some wax but I would like to recheck that area in the next few weeks.  The remainder of your ear canal and eardrum looks good.  If any return of difficulty hearing, or the ear feels blocked, recheck  sooner.   Thank you for coming in today.   If you have lab work done today you will be contacted with your lab results within the next 2 weeks.  If you have not heard from [place] then please contact us. The fastest way to get your results is to register for My Chart.   IF you received an x-ray today, you will receive an invoice from [facility]. Please contact [facility] at [phone] with questions or concerns regarding your invoice.   IF you received labwork today, you will receive  an Pharmacologist from [facility]. Please contact [facility] at [phone] with questions or concerns regarding your invoice.   Our billing staff will not be able to assist you with questions regarding bills from these companies.  You will be contacted with the lab results as soon as they are available. The fastest way to get your results is to activate your My Chart account. Instructions are located on the last page of this paperwork. If you have not heard from [place] regarding the results in 2 weeks, please contact this office.          Signed, Merri Ray, MD [facility]

## 2020-03-30 ENCOUNTER — Telehealth: Payer: Self-pay | Admitting: Family Medicine

## 2020-03-30 NOTE — Telephone Encounter (Signed)
LVMTCB to resch appt on 04/08/20 with another provider due to provider being out of the office. Please advise.

## 2020-04-05 ENCOUNTER — Encounter: Payer: Self-pay | Admitting: Emergency Medicine

## 2020-04-05 ENCOUNTER — Ambulatory Visit (INDEPENDENT_AMBULATORY_CARE_PROVIDER_SITE_OTHER): Payer: BC Managed Care – PPO | Admitting: Emergency Medicine

## 2020-04-05 ENCOUNTER — Other Ambulatory Visit: Payer: Self-pay

## 2020-04-05 VITALS — BP 131/79 | HR 79 | Temp 98.3°F | Resp 16 | Ht 71.5 in | Wt 193.4 lb

## 2020-04-05 DIAGNOSIS — H938X1 Other specified disorders of right ear: Secondary | ICD-10-CM | POA: Diagnosis not present

## 2020-04-05 NOTE — Progress Notes (Signed)
Dale Butler 54 y.o.   Chief Complaint  Patient presents with  . Ear Problem    follow up of Right ear, per patient his ear is doing a little bit better    HISTORY OF PRESENT ILLNESS: This is a 54 y.o. male here for follow-up of right ear issue.  Seen by Dr. Carlota Raspberry 10 days ago. Doing better with much improvement. No additional symptoms, other complaints or medical concerns.  HPI   Prior to Admission medications   Medication Sig Start Date End Date Taking? Authorizing Provider  meloxicam (MOBIC) 7.5 MG tablet Take 1 tablet (7.5 mg total) by mouth daily. Patient not taking: Reported on 04/05/2020 10/28/18   Wendie Agreste, MD  prednisoLONE acetate (PRED FORTE) 1 % ophthalmic suspension 1 drop 5 (five) times daily. Patient not taking: Reported on 04/05/2020    [provider]  ranitidine (ZANTAC) 150 MG tablet Take 150 mg by mouth 2 (two) times daily. If needed Patient not taking: Reported on 04/05/2020    [provider]    No Known Allergies  Patient Active Problem List   Diagnosis Date Noted  . GERD (gastroesophageal reflux disease) 12/26/2011  . Smoker 12/26/2011  . Chest pain 12/26/2011    Past Medical History:  Diagnosis Date  . Chest pain   . GERD (gastroesophageal reflux disease)     No past surgical history on file.  Social History   Socioeconomic History  . Marital status: Married    Spouse name: Not on file  . Number of children: Not on file  . Years of education: Not on file  . Highest education level: Not on file  Occupational History  . Not on file  Tobacco Use  . Smoking status: Current Every Day Smoker    Packs/day: 0.25  . Smokeless tobacco: Never Used  Vaping Use  . Vaping Use: Never used  Substance and Sexual Activity  . Alcohol use: Yes    Alcohol/week: 0.0 standard drinks    Comment: daily- beer  . Drug use: No  . Sexual activity: Not on file  Other Topics Concern  . Not on file  Social History Narrative  .  Not on file   Social Determinants of Health   Financial Resource Strain:   . Difficulty of Paying Living Expenses: Not on file  Food Insecurity:   . Worried About Charity fundraiser in the Last Year: Not on file  . Ran Out of Food in the Last Year: Not on file  Transportation Needs:   . Lack of Transportation (Medical): Not on file  . Lack of Transportation (Non-Medical): Not on file  Physical Activity:   . Days of Exercise per Week: Not on file  . Minutes of Exercise per Session: Not on file  Stress:   . Feeling of Stress : Not on file  Social Connections:   . Frequency of Communication with Friends and Family: Not on file  . Frequency of Social Gatherings with Friends and Family: Not on file  . Attends Religious Services: Not on file  . Active Member of Clubs or Organizations: Not on file  . Attends Archivist Meetings: Not on file  . Marital Status: Not on file  Intimate Partner Violence:   . Fear of Current or Ex-Partner: Not on file  . Emotionally Abused: Not on file  . Physically Abused: Not on file  . Sexually Abused: Not on file    Family History  Problem Relation Age of  Onset  . Cancer Mother   . Hypertension Father   . Heart disease Father   . Alcohol abuse Brother   . Colon cancer Neg Hx   . Esophageal cancer Neg Hx   . Rectal cancer Neg Hx   . Stomach cancer Neg Hx      Review of Systems  Constitutional: Negative.  Negative for chills and fever.  HENT: Negative.  Negative for congestion, hearing loss, sore throat and tinnitus.   Respiratory: Negative.  Negative for cough and shortness of breath.   Cardiovascular: Negative for chest pain and palpitations.  Gastrointestinal: Negative for abdominal pain, nausea and vomiting.  Genitourinary: Negative.  Negative for dysuria and hematuria.  Musculoskeletal: Negative.  Negative for myalgias.  Skin: Negative.   Neurological: Negative.  Negative for dizziness and headaches.  All other systems  reviewed and are negative.    Physical Exam Vitals reviewed.  Constitutional:      Appearance: Normal appearance.  HENT:     Head: Normocephalic.     Right Ear: Tympanic membrane, ear canal and external ear normal.     Left Ear: Tympanic membrane, ear canal and external ear normal.  Eyes:     Extraocular Movements: Extraocular movements intact.     Conjunctiva/sclera: Conjunctivae normal.     Pupils: Pupils are equal, round, and reactive to light.  Cardiovascular:     Rate and Rhythm: Normal rate.  Pulmonary:     Effort: Pulmonary effort is normal.  Musculoskeletal:        General: Normal range of motion.  Skin:    General: Skin is warm and dry.     Capillary Refill: Capillary refill takes less than 2 seconds.  Neurological:     General: No focal deficit present.     Mental Status: He is alert and oriented to person, place, and time.  Psychiatric:        Mood and Affect: Mood normal.        Behavior: Behavior normal.      ASSESSMENT & PLAN:  [name] was seen today for ear problem.  Diagnoses and all orders for this visit:  Ear fullness, right Comments: Much improved    Patient Instructions       If you have lab work done today you will be contacted with your lab results within the next 2 weeks.  If you have not heard from [place] then please contact us. The fastest way to get your results is to register for My Chart.   IF you received an x-ray today, you will receive an invoice from [facility]. Please contact [facility] at [phone] with questions or concerns regarding your invoice.   IF you received labwork today, you will receive an invoice from [place]. Please contact [facility] at [phone] with questions or concerns regarding your invoice.   Our billing staff will not be able to assist you with questions regarding bills from these companies.  You will be contacted with the lab results as soon as they are available. The fastest way  to get your results is to activate your My Chart account. Instructions are located on the last page of this paperwork. If you have not heard from [place] regarding the results in 2 weeks, please contact this office.     Health Maintenance, Male Adopting a healthy lifestyle and getting preventive care are important in promoting health and wellness. Ask your health care provider about:  The right schedule for you to have regular tests and  exams.  Things you can do on your own to prevent diseases and keep yourself healthy. What should I know about diet, weight, and exercise? Eat a healthy diet   Eat a diet that includes plenty of vegetables, fruits, low-fat dairy products, and lean protein.  Do not eat a lot of foods that are high in solid fats, added sugars, or sodium. Maintain a healthy weight Body mass index (BMI) is a measurement that can be used to identify possible weight problems. It estimates body fat based on height and weight. Your health care provider can help determine your BMI and help you achieve or maintain a healthy weight. Get regular exercise Get regular exercise. This is one of the most important things you can do for your health. Most adults should:  Exercise for at least 150 minutes each week. The exercise should increase your heart rate and make you sweat (moderate-intensity exercise).  Do strengthening exercises at least twice a week. This is in addition to the moderate-intensity exercise.  Spend less time sitting. Even light physical activity can be beneficial. Watch cholesterol and blood lipids Have your blood tested for lipids and cholesterol at 55 years of age, then have this test every 5 years. You may need to have your cholesterol levels checked more often if:  Your lipid or cholesterol levels are high.  You are older than 55 years of age.  You are at high risk for heart disease. What should I know about cancer screening? Many types of cancers can be detected  early and may often be prevented. Depending on your health history and family history, you may need to have cancer screening at various ages. This may include screening for:  Colorectal cancer.  Prostate cancer.  Skin cancer.  Lung cancer. What should I know about heart disease, diabetes, and high blood pressure? Blood pressure and heart disease  High blood pressure causes heart disease and increases the risk of stroke. This is more likely to develop in people who have high blood pressure readings, are of African descent, or are overweight.  Talk with your health care provider about your target blood pressure readings.  Have your blood pressure checked: ? Every 3-5 years if you are 52-86 years of age. ? Every year if you are 32 years old or older.  If you are between the ages of 27 and 81 and are a current or former smoker, ask your health care provider if you should have a one-time screening for abdominal aortic aneurysm (AAA). Diabetes Have regular diabetes screenings. This checks your fasting blood sugar level. Have the screening done:  Once every three years after age 35 if you are at a normal weight and have a low risk for diabetes.  More often and at a younger age if you are overweight or have a high risk for diabetes. What should I know about preventing infection? Hepatitis B If you have a higher risk for hepatitis B, you should be screened for this virus. Talk with your health care provider to find out if you are at risk for hepatitis B infection. Hepatitis C Blood testing is recommended for:  Everyone born from [birth year] through 1966.  Anyone with known risk factors for hepatitis C. Sexually transmitted infections (STIs)  You should be screened each year for STIs, including gonorrhea and chlamydia, if: ? You are sexually active and are younger than 55 years of age. ? You are older than 55 years of age and your health care provider tells  you that you are at risk for this  type of infection. ? Your sexual activity has changed since you were last screened, and you are at increased risk for chlamydia or gonorrhea. Ask your health care provider if you are at risk.  Ask your health care provider about whether you are at high risk for HIV. Your health care provider may recommend a prescription medicine to help prevent HIV infection. If you choose to take medicine to prevent HIV, you should first get tested for HIV. You should then be tested every 3 months for as long as you are taking the medicine. Follow these instructions at home: Lifestyle  Do not use any products that contain nicotine or tobacco, such as cigarettes, e-cigarettes, and chewing tobacco. If you need help quitting, ask your health care provider.  Do not use street drugs.  Do not share needles.  Ask your health care provider for help if you need support or information about quitting drugs. Alcohol use  Do not drink alcohol if your health care provider tells you not to drink.  If you drink alcohol: ? Limit how much you have to 0-2 drinks a day. ? Be aware of how much alcohol is in your drink. In the [place] one drink equals one 12 oz bottle of beer (355 mL), one 5 oz glass of wine (148 mL), or one 1 oz glass of hard liquor (44 mL). General instructions  Schedule regular health, dental, and eye exams.  Stay current with your vaccines.  Tell your health care provider if: ? You often feel depressed. ? You have ever been abused or do not feel safe at home. Summary  Adopting a healthy lifestyle and getting preventive care are important in promoting health and wellness.  Follow your health care provider's instructions about healthy diet, exercising, and getting tested or screened for diseases.  Follow your health care provider's instructions on monitoring your cholesterol and blood pressure. This information is not intended to replace advice given to you by your health care provider. Make sure  you discuss any questions you have with your health care provider. Document Revised: 06/04/2018 Document Reviewed: 06/04/2018 Elsevier Patient Education  2020 Elsevier Inc.      Agustina Caroli, MD [facility]

## 2020-04-05 NOTE — Patient Instructions (Addendum)
   If you have lab work done today you will be contacted with your lab results within the next 2 weeks.  If you have not heard from us then please contact us. The fastest way to get your results is to register for My Chart.   IF you received an x-ray today, you will receive an invoice from [facility]. Please contact [facility] at [phone] with questions or concerns regarding your invoice.   IF you received labwork today, you will receive an invoice from [facility]. Please contact [facility] at [phone] with questions or concerns regarding your invoice.   Our billing staff will not be able to assist you with questions regarding bills from these companies.  You will be contacted with the lab results as soon as they are available. The fastest way to get your results is to activate your My Chart account. Instructions are located on the last page of this paperwork. If you have not heard from us regarding the results in 2 weeks, please contact this office.      Health Maintenance, Male Adopting a healthy lifestyle and getting preventive care are important in promoting health and wellness. Ask your health care provider about:  The right schedule for you to have regular tests and exams.  Things you can do on your own to prevent diseases and keep yourself healthy. What should I know about diet, weight, and exercise? Eat a healthy diet   Eat a diet that includes plenty of vegetables, fruits, low-fat dairy products, and lean protein.  Do not eat a lot of foods that are high in solid fats, added sugars, or sodium. Maintain a healthy weight Body mass index (BMI) is a measurement that can be used to identify possible weight problems. It estimates body fat based on height and weight. Your health care provider can help determine your BMI and help you achieve or maintain a healthy weight. Get regular exercise Get regular exercise. This is one of the most important things you  can do for your health. Most adults should:  Exercise for at least 150 minutes each week. The exercise should increase your heart rate and make you sweat (moderate-intensity exercise).  Do strengthening exercises at least twice a week. This is in addition to the moderate-intensity exercise.  Spend less time sitting. Even light physical activity can be beneficial. Watch cholesterol and blood lipids Have your blood tested for lipids and cholesterol at 55 years of age, then have this test every 5 years. You may need to have your cholesterol levels checked more often if:  Your lipid or cholesterol levels are high.  You are older than 55 years of age.  You are at high risk for heart disease. What should I know about cancer screening? Many types of cancers can be detected early and may often be prevented. Depending on your health history and family history, you may need to have cancer screening at various ages. This may include screening for:  Colorectal cancer.  Prostate cancer.  Skin cancer.  Lung cancer. What should I know about heart disease, diabetes, and high blood pressure? Blood pressure and heart disease  High blood pressure causes heart disease and increases the risk of stroke. This is more likely to develop in people who have high blood pressure readings, are of African descent, or are overweight.  Talk with your health care provider about your target blood pressure readings.  Have your blood pressure checked: ? Every 3-5 years if you are 18-39   years of age. ? Every year if you are 40 years old or older.  If you are between the ages of 65 and 75 and are a current or former smoker, ask your health care provider if you should have a one-time screening for abdominal aortic aneurysm (AAA). Diabetes Have regular diabetes screenings. This checks your fasting blood sugar level. Have the screening done:  Once every three years after age 45 if you are at a normal weight and have  a low risk for diabetes.  More often and at a younger age if you are overweight or have a high risk for diabetes. What should I know about preventing infection? Hepatitis B If you have a higher risk for hepatitis B, you should be screened for this virus. Talk with your health care provider to find out if you are at risk for hepatitis B infection. Hepatitis C Blood testing is recommended for:  Everyone born from 1945 through 1965.  Anyone with known risk factors for hepatitis C. Sexually transmitted infections (STIs)  You should be screened each year for STIs, including gonorrhea and chlamydia, if: ? You are sexually active and are younger than 55 years of age. ? You are older than 55 years of age and your health care provider tells you that you are at risk for this type of infection. ? Your sexual activity has changed since you were last screened, and you are at increased risk for chlamydia or gonorrhea. Ask your health care provider if you are at risk.  Ask your health care provider about whether you are at high risk for HIV. Your health care provider may recommend a prescription medicine to help prevent HIV infection. If you choose to take medicine to prevent HIV, you should first get tested for HIV. You should then be tested every 3 months for as long as you are taking the medicine. Follow these instructions at home: Lifestyle  Do not use any products that contain nicotine or tobacco, such as cigarettes, e-cigarettes, and chewing tobacco. If you need help quitting, ask your health care provider.  Do not use street drugs.  Do not share needles.  Ask your health care provider for help if you need support or information about quitting drugs. Alcohol use  Do not drink alcohol if your health care provider tells you not to drink.  If you drink alcohol: ? Limit how much you have to 0-2 drinks a day. ? Be aware of how much alcohol is in your drink. In the [place] one drink equals one 12  oz bottle of beer (355 mL), one 5 oz glass of wine (148 mL), or one 1 oz glass of hard liquor (44 mL). General instructions  Schedule regular health, dental, and eye exams.  Stay current with your vaccines.  Tell your health care provider if: ? You often feel depressed. ? You have ever been abused or do not feel safe at home. Summary  Adopting a healthy lifestyle and getting preventive care are important in promoting health and wellness.  Follow your health care provider's instructions about healthy diet, exercising, and getting tested or screened for diseases.  Follow your health care provider's instructions on monitoring your cholesterol and blood pressure. This information is not intended to replace advice given to you by your health care provider. Make sure you discuss any questions you have with your health care provider. Document Revised: 06/04/2018 Document Reviewed: 06/04/2018 Elsevier Patient Education  2020 [employer] Inc.  

## 2020-04-08 ENCOUNTER — Ambulatory Visit: Payer: BC Managed Care – PPO | Admitting: Family Medicine

## 2020-05-09 ENCOUNTER — Encounter: Payer: Self-pay | Admitting: Family Medicine

## 2020-05-09 ENCOUNTER — Ambulatory Visit (INDEPENDENT_AMBULATORY_CARE_PROVIDER_SITE_OTHER): Payer: BC Managed Care – PPO | Admitting: Family Medicine

## 2020-05-09 ENCOUNTER — Ambulatory Visit (INDEPENDENT_AMBULATORY_CARE_PROVIDER_SITE_OTHER): Payer: BC Managed Care – PPO

## 2020-05-09 VITALS — BP 137/86 | HR 66 | Temp 99.2°F | Ht 71.5 in | Wt 195.0 lb

## 2020-05-09 DIAGNOSIS — M545 Low back pain, unspecified: Secondary | ICD-10-CM

## 2020-05-09 MED ORDER — MELOXICAM 7.5 MG PO TABS: 7.5000 mg | ORAL_TABLET | Freq: Every day | ORAL | 0 refills | Status: AC

## 2020-05-09 MED ORDER — CYCLOBENZAPRINE HCL 5 MG PO TABS: ORAL_TABLET | ORAL | 0 refills | Status: AC

## 2020-05-09 NOTE — Patient Instructions (Addendum)
Try the exercises and other information below, meloxicam once during the day as needed (avoid taking other NSAIDS like Alleve or Ibuprofen while taking this) and then flexeril if needed at bedtime for muscle spasm. This can be taken up to every 8 hours, but causes sedation, so should not drive or operate heavy machinery while taking this medicine.  Follow up in next 2 weeks if not improving. Return to the clinic or go to the nearest emergency room if any of your symptoms worsen or new symptoms occur.    Acute Back Pain, Adult Acute back pain is sudden and usually short-lived. It is often caused by an injury to the muscles and tissues in the back. The injury may result from:  A muscle or ligament getting overstretched or torn (strained). Ligaments are tissues that connect bones to each other. Lifting something improperly can cause a back strain.  Wear and tear (degeneration) of the spinal disks. Spinal disks are circular tissue that provides cushioning between the bones of the spine (vertebrae).  Twisting motions, such as while playing sports or doing yard work.  A hit to the back.  Arthritis. You may have a physical exam, lab tests, and imaging tests to find the cause of your pain. Acute back pain usually goes away with rest and home care. Follow these instructions at home: Managing pain, stiffness, and swelling  Take over-the-counter and prescription medicines only as told by your health care provider.  Your health care provider may recommend applying ice during the first 24-48 hours after your pain starts. To do this: ? Put ice in a plastic bag. ? Place a towel between your skin and the bag. ? Leave the ice on for 20 minutes, 2-3 times a day.  If directed, apply heat to the affected area as often as told by your health care provider. Use the heat source that your health care provider recommends, such as a moist heat pack or a heating pad. ? Place a towel between your skin and the heat  source. ? Leave the heat on for 20-30 minutes. ? Remove the heat if your skin turns bright red. This is especially important if you are unable to feel pain, heat, or cold. You have a greater risk of getting burned. Activity   Do not stay in bed. Staying in bed for more than 1-2 days can delay your recovery.  Sit up and stand up straight. Avoid leaning forward when you sit, or hunching over when you stand. ? If you work at a desk, sit close to it so you do not need to lean over. Keep your chin tucked in. Keep your neck drawn back, and keep your elbows bent at a right angle. Your arms should look like the letter "L." ? Sit high and close to the steering wheel when you drive. Add lower back (lumbar) support to your car seat, if needed.  Take short walks on even surfaces as soon as you are able. Try to increase the length of time you walk each day.  Do not sit, drive, or stand in one place for more than 30 minutes at a time. Sitting or standing for long periods of time can put stress on your back.  Do not drive or use heavy machinery while taking prescription pain medicine.  Use proper lifting techniques. When you bend and lift, use positions that put less stress on your back: ? [place] your knees. ? Keep the load close to your body. ? Avoid twisting.  Exercise regularly as told by your health care provider. Exercising helps your back heal faster and helps prevent back injuries by keeping muscles strong and flexible.  Work with a physical therapist to make a safe exercise program, as recommended by your health care provider. Do any exercises as told by your physical therapist. Lifestyle  Maintain a healthy weight. Extra weight puts stress on your back and makes it difficult to have good posture.  Avoid activities or situations that make you feel anxious or stressed. Stress and anxiety increase muscle tension and can make back pain worse. Learn ways to manage anxiety and stress, such as through  exercise. General instructions  Sleep on a firm mattress in a comfortable position. Try lying on your side with your knees slightly bent. If you lie on your back, put a pillow under your knees.  Follow your treatment plan as told by your health care provider. This may include: ? Cognitive or behavioral therapy. ? Acupuncture or massage therapy. ? Meditation or yoga. Contact a health care provider if:  You have pain that is not relieved with rest or medicine.  You have increasing pain going down into your legs or buttocks.  Your pain does not improve after 2 weeks.  You have pain at night.  You lose weight without trying.  You have a fever or chills. Get help right away if:  You develop new bowel or bladder control problems.  You have unusual weakness or numbness in your arms or legs.  You develop nausea or vomiting.  You develop abdominal pain.  You feel faint. Summary  Acute back pain is sudden and usually short-lived.  Use proper lifting techniques. When you bend and lift, use positions that put less stress on your back.  Take over-the-counter and prescription medicines and apply heat or ice as directed by your health care provider. This information is not intended to replace advice given to you by your health care provider. Make sure you discuss any questions you have with your health care provider. Document Revised: 09/30/2018 Document Reviewed: 01/23/2017 Elsevier Patient Education  [employer].   If you have lab work done today you will be contacted with your lab results within the next 2 weeks.  If you have not heard from [place] then please contact us. The fastest way to get your results is to register for My Chart.   IF you received an x-ray today, you will receive an invoice from [facility]. Please contact [facility] at [phone] with questions or concerns regarding your invoice.   IF you received labwork today, you will receive an  invoice from [place]. Please contact [facility] at [phone] with questions or concerns regarding your invoice.   Our billing staff will not be able to assist you with questions regarding bills from these companies.  You will be contacted with the lab results as soon as they are available. The fastest way to get your results is to activate your My Chart account. Instructions are located on the last page of this paperwork. If you have not heard from [place] regarding the results in 2 weeks, please contact this office.

## 2020-05-09 NOTE — Progress Notes (Signed)
Subjective:  Patient ID: Dale Butler, male    DOB: 29-Aug-1965  Age: 54 y.o. MRN: [id number]  CC:  Chief Complaint  Patient presents with   Back Pain    Pt reports pain in his lower back at his waist line. Pt reports no know tramma to the area. pt reports that last week his back was in pain and giving him spasams. pt states that he works in [employer], but no know tramma. pt states his current pain level is 8/10. pt would like an x-ray. Pt states whe he looks at him self  in the mirror on friday ihe was standing straight, but the pt states it looked like his hip was out of line with his spine. pt described a S curve.    HPI Dale Butler presents for   Low back pain: Tightness in middle loward of low back/waist line. Past 6 weeks. No fall/injury, no new activities known.  No heavy lifting - at the most carrying a ladder at times.  Did some yardwork, with unloading mulch prior. Cramp in back of left thigh at times, with tightness in back. Does not cross knee. Episodes of cramp in area at times. Past few days better. No bowel or bladder incontinence, no saddle anesthesia, no lower extremity weakness.  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  When pain flares - some difficulty with sleep at those   Tx: heat pack, biofreeze - some relief , exlax. - min relief   History Patient Active Problem List   Diagnosis Date Noted   GERD (gastroesophageal reflux disease) 12/26/2011   Smoker 12/26/2011   Chest pain 12/26/2011   Past Medical History:  Diagnosis Date   Chest pain    GERD (gastroesophageal reflux disease)    No past surgical history on file. No Known Allergies Prior to Admission medications   Medication Sig Start Date End Date Taking? Authorizing Provider  meloxicam (MOBIC) 7.5 MG tablet Take 1 tablet (7.5 mg total) by mouth daily. Patient not taking: Reported on 05/09/2020 10/28/18   Wendie Agreste, MD    prednisoLONE acetate (PRED FORTE) 1 % ophthalmic suspension 1 drop 5 (five) times daily.  Patient not taking: Reported on 05/09/2020    [provider]  ranitidine (ZANTAC) 150 MG tablet Take 150 mg by mouth 2 (two) times daily. If needed  Patient not taking: Reported on 05/09/2020    [provider]   Social History   Socioeconomic History   Marital status: Married    Spouse name: Not on file   Number of children: Not on file   Years of education: Not on file   Highest education level: Not on file  Occupational History   Not on file  Tobacco Use   Smoking status: Current Every Day Smoker    Packs/day: 0.25   Smokeless tobacco: Never Used  Vaping Use   Vaping Use: Never used  Substance and Sexual Activity   Alcohol use: Yes    Alcohol/week: 0.0 standard drinks    Comment: daily- beer   Drug use: No   Sexual activity: Not on file  Other Topics Concern   Not on file  Social History Narrative   Not on file   Social Determinants of Health   Financial Resource Strain:    Difficulty of Paying Living Expenses: Not on file  Food Insecurity:    Worried About Running Out of Food  in the Last Year: Not on file   Ran Out of Food in the Last Year: Not on file  Transportation Needs:    Lack of Transportation (Medical): Not on file   Lack of Transportation (Non-Medical): Not on file  Physical Activity:    Days of Exercise per Week: Not on file   Minutes of Exercise per Session: Not on file  Stress:    Feeling of Stress : Not on file  Social Connections:    Frequency of Communication with Friends and Family: Not on file   Frequency of Social Gatherings with Friends and Family: Not on file   Attends Religious Services: Not on file   Active Member of Clubs or Organizations: Not on file   Attends Archivist Meetings: Not on file   Marital Status: Not on file  Intimate Partner Violence:    Fear of Current or Ex-Partner: Not  on file   Emotionally Abused: Not on file   Physically Abused: Not on file   Sexually Abused: Not on file    Review of Systems Per HPI.    Objective:   Vitals:   05/09/20 1148  BP: 137/86  Pulse: 66  Temp: 99.2 F (37.3 C)  TempSrc: Temporal  SpO2: 97%  Weight: 195 lb (88.5 kg)  Height: 5' 11.5" (1.816 m)   Physical Exam Constitutional:      General: He is not in acute distress.    Appearance: He is well-developed.  HENT:     Head: Normocephalic and atraumatic.  Cardiovascular:     Rate and Rhythm: Normal rate.  Pulmonary:     Effort: Pulmonary effort is normal.  Musculoskeletal:     Comments: Lumbar spine: Flex to 80 degrees, intact ROM. Able to heel and toe walk without difficulty. Negative seated SLR, reflexes 2+patella, achilles bilaterally, babinski negative bilaterally.    Skin:    General: Skin is warm and dry.     Findings: No rash.  Neurological:     Mental Status: He is alert and oriented to person, place, and time.  Psychiatric:        Mood and Affect: Mood normal.        Behavior: Behavior normal.      Assessment & Plan:  Dale Butler is a 54 y.o. male . Low back pain, unspecified back pain laterality, unspecified chronicity, unspecified whether sciatica present - Plan: DG Lumbar Spine Complete, meloxicam (MOBIC) 7.5 MG tablet, cyclobenzaprine (FLEXERIL) 5 MG tablet  - overuse/strain likely. Imaging pending, but no red flags on exam.   - handout on acute back pain, trial of mobic, flexeril, with RTC precautions.   Meds ordered this encounter  Medications   meloxicam (MOBIC) 7.5 MG tablet    Sig: Take 1 tablet (7.5 mg total) by mouth daily.    Dispense:  30 tablet    Refill:  0   cyclobenzaprine (FLEXERIL) 5 MG tablet    Sig: 1 pill by mouth up to every 8 hours as needed. Start with one pill by mouth each bedtime as needed due to sedation    Dispense:  15 tablet    Refill:  0   Patient Instructions   Try the exercises and other  information below, meloxicam once during the day as needed (avoid taking other NSAIDS like Alleve or Ibuprofen while taking this) and then flexeril if needed at bedtime for muscle spasm. This can be taken up to every 8 hours, but causes sedation, so should not drive  or operate heavy machinery while taking this medicine.  Follow up in next 2 weeks if not improving. Return to the clinic or go to the nearest emergency room if any of your symptoms worsen or new symptoms occur.    Acute Back Pain, Adult Acute back pain is sudden and usually short-lived. It is often caused by an injury to the muscles and tissues in the back. The injury may result from:  A muscle or ligament getting overstretched or torn (strained). Ligaments are tissues that connect bones to each other. Lifting something improperly can cause a back strain.  Wear and tear (degeneration) of the spinal disks. Spinal disks are circular tissue that provides cushioning between the bones of the spine (vertebrae).  Twisting motions, such as while playing sports or doing yard work.  A hit to the back.  Arthritis. You may have a physical exam, lab tests, and imaging tests to find the cause of your pain. Acute back pain usually goes away with rest and home care. Follow these instructions at home: Managing pain, stiffness, and swelling  Take over-the-counter and prescription medicines only as told by your health care provider.  Your health care provider may recommend applying ice during the first 24-48 hours after your pain starts. To do this: ? Put ice in a plastic bag. ? Place a towel between your skin and the bag. ? Leave the ice on for 20 minutes, 2-3 times a day.  If directed, apply heat to the affected area as often as told by your health care provider. Use the heat source that your health care provider recommends, such as a moist heat pack or a heating pad. ? Place a towel between your skin and the heat source. ? Leave the heat on  for 20-30 minutes. ? Remove the heat if your skin turns bright red. This is especially important if you are unable to feel pain, heat, or cold. You have a greater risk of getting burned. Activity   Do not stay in bed. Staying in bed for more than 1-2 days can delay your recovery.  Sit up and stand up straight. Avoid leaning forward when you sit, or hunching over when you stand. ? If you work at a desk, sit close to it so you do not need to lean over. Keep your chin tucked in. Keep your neck drawn back, and keep your elbows bent at a right angle. Your arms should look like the letter "L." ? Sit high and close to the steering wheel when you drive. Add lower back (lumbar) support to your car seat, if needed.  Take short walks on even surfaces as soon as you are able. Try to increase the length of time you walk each day.  Do not sit, drive, or stand in one place for more than 30 minutes at a time. Sitting or standing for long periods of time can put stress on your back.  Do not drive or use heavy machinery while taking prescription pain medicine.  Use proper lifting techniques. When you bend and lift, use positions that put less stress on your back: ? [place] your knees. ? Keep the load close to your body. ? Avoid twisting.  Exercise regularly as told by your health care provider. Exercising helps your back heal faster and helps prevent back injuries by keeping muscles strong and flexible.  Work with a physical therapist to make a safe exercise program, as recommended by your health care provider. Do any exercises as told  by your physical therapist. Lifestyle  Maintain a healthy weight. Extra weight puts stress on your back and makes it difficult to have good posture.  Avoid activities or situations that make you feel anxious or stressed. Stress and anxiety increase muscle tension and can make back pain worse. Learn ways to manage anxiety and stress, such as through exercise. General  instructions  Sleep on a firm mattress in a comfortable position. Try lying on your side with your knees slightly bent. If you lie on your back, put a pillow under your knees.  Follow your treatment plan as told by your health care provider. This may include: ? Cognitive or behavioral therapy. ? Acupuncture or massage therapy. ? Meditation or yoga. Contact a health care provider if:  You have pain that is not relieved with rest or medicine.  You have increasing pain going down into your legs or buttocks.  Your pain does not improve after 2 weeks.  You have pain at night.  You lose weight without trying.  You have a fever or chills. Get help right away if:  You develop new bowel or bladder control problems.  You have unusual weakness or numbness in your arms or legs.  You develop nausea or vomiting.  You develop abdominal pain.  You feel faint. Summary  Acute back pain is sudden and usually short-lived.  Use proper lifting techniques. When you bend and lift, use positions that put less stress on your back.  Take over-the-counter and prescription medicines and apply heat or ice as directed by your health care provider. This information is not intended to replace advice given to you by your health care provider. Make sure you discuss any questions you have with your health care provider. Document Revised: 09/30/2018 Document Reviewed: 01/23/2017 Elsevier Patient Education  [employer].   If you have lab work done today you will be contacted with your lab results within the next 2 weeks.  If you have not heard from [place] then please contact us. The fastest way to get your results is to register for My Chart.   IF you received an x-ray today, you will receive an invoice from [facility]. Please contact [facility] at [phone] with questions or concerns regarding your invoice.   IF you received labwork today, you will receive an invoice from  [place]. Please contact [facility] at [phone] with questions or concerns regarding your invoice.   Our billing staff will not be able to assist you with questions regarding bills from these companies.  You will be contacted with the lab results as soon as they are available. The fastest way to get your results is to activate your My Chart account. Instructions are located on the last page of this paperwork. If you have not heard from [place] regarding the results in 2 weeks, please contact this office.         Signed, Merri Ray, MD [facility]

## 2020-05-11 ENCOUNTER — Telehealth: Payer: Self-pay

## 2020-05-11 NOTE — Telephone Encounter (Signed)
LVM for return call for XR results .

## 2020-05-20 ENCOUNTER — Ambulatory Visit: Payer: BC Managed Care – PPO | Admitting: Family Medicine

## 2020-06-03 DIAGNOSIS — H16202 Unspecified keratoconjunctivitis, left eye: Secondary | ICD-10-CM | POA: Diagnosis not present

## 2020-06-07 DIAGNOSIS — H16202 Unspecified keratoconjunctivitis, left eye: Secondary | ICD-10-CM | POA: Diagnosis not present

## 2021-08-10 ENCOUNTER — Encounter: Payer: BC Managed Care – PPO | Admitting: Family Medicine

## 2022-03-01 ENCOUNTER — Ambulatory Visit (INDEPENDENT_AMBULATORY_CARE_PROVIDER_SITE_OTHER): Payer: BC Managed Care – PPO | Admitting: Family Medicine

## 2022-03-01 ENCOUNTER — Encounter: Payer: Self-pay | Admitting: Family Medicine

## 2022-03-01 VITALS — BP 138/80 | HR 74 | Temp 98.0°F | Ht 71.0 in | Wt 184.6 lb

## 2022-03-01 DIAGNOSIS — M7042 Prepatellar bursitis, left knee: Secondary | ICD-10-CM | POA: Diagnosis not present

## 2022-03-01 DIAGNOSIS — M25561 Pain in right knee: Secondary | ICD-10-CM | POA: Diagnosis not present

## 2022-03-01 DIAGNOSIS — M7041 Prepatellar bursitis, right knee: Secondary | ICD-10-CM

## 2022-03-01 DIAGNOSIS — M25562 Pain in left knee: Secondary | ICD-10-CM

## 2022-03-01 NOTE — Patient Instructions (Signed)
I suspect you had a possible bursitis in the knee or some inflammation behind the kneecap with the previous work.  Okay to use over-the-counter Biofreeze or other muscle creams if those help but Voltaren gel may work a little better.  That is also over-the-counter and can be used a few times each day.  Wear knee pads with any work on your knees, but try to avoid too much direct pressure to the kneecaps.  If soreness does not continue to improve in the next week to 10 days, let me know and we can look at other treatments or possible imaging.  Keep follow-up for physical as planned but let me know if there are questions sooner.  Take care  Prepatellar Bursitis  Prepatellar bursitis is inflammation of the prepatellar bursa, which is a fluid-filled sac that cushions the kneecap (patella). Prepatellar bursitis happens when fluid builds up in this sac and causes it to swell. The condition causes knee pain. What are the causes? This condition may be caused by: Constant pressure on the knees from kneeling. A hit to the knee. Falling on the knee. Infection from bacteria. Moving the knee often in a forceful way. What increases the risk? You are more likely to develop this condition if: You play sports that have a high risk of falling on the knee or being hit on the knee. These include football, wrestling, basketball, or soccer. You do work in which you kneel for long periods of time, such as roofing, plumbing, or gardening. You have another inflammatory condition, such as gout or rheumatoid arthritis. What are the signs or symptoms? The most common symptom of this condition is knee pain that gets better with rest. Other symptoms include: Swelling on the front of the kneecap. Warmth in the knee. Tenderness with activity. Redness in the knee. Inability to bend the knee or to kneel. How is this diagnosed? This condition is diagnosed based on: A physical exam. Your health care provider will compare your  knees and check for tenderness and pain while moving your knee. Your medical history. Tests to check for infection. These may include blood tests and tests on the fluid in the bursa. Imaging tests, such as X-ray, MRI, or ultrasound, to check for damage in the patella, or fluid buildup and swelling in the bursa. How is this treated? This condition may be treated by: Resting the knee. Putting ice on the knee. Taking medicines, such as: NSAIDs. These can help to reduce pain and swelling. Antibiotics. These may be needed if you have an infection. Steroids. These are used to reduce swelling and inflammation, and may be prescribed if other treatments are not helping. Raising (elevating) the knee while resting. Doing exercises to help you maintain movement (physical therapy). These may be recommended after pain and swelling improve. Having a procedure to remove fluid from the bursa. This may be done if other treatments are not helping. Having surgery to remove the bursa. This may be done if you have a severe infection or if the condition keeps coming back after treatment. Follow these instructions at home: Medicines Take over-the-counter and prescription medicines only as told by your health care provider. If you were prescribed an antibiotic medicine, take it as told by your health care provider. Do not stop taking the antibiotic even if you start to feel better. Managing pain, stiffness, and swelling  If directed, put ice on the injured area. Put ice in a plastic bag. Place a towel between your skin and the  bag. Leave the ice on for 20 minutes, 2-3 times a day. Elevate the injured area above the level of your heart while you are sitting or lying down. Activity Do not use the injured limb to support your body weight until your health care provider says that you can. Rest your knee. Avoid activities that cause knee pain. Return to your normal activities as told by your health care provider.  Ask your health care provider what activities are safe for you. Do exercises as told by your health care provider. General instructions Ask your health care provider when it is safe for you to drive. Do not use any products that contain nicotine or tobacco, such as cigarettes, e-cigarettes, and chewing tobacco. These can delay healing. If you need help quitting, ask your health care provider. Keep all follow-up visits as told by your health care provider. This is important. How is this prevented? Warm up and stretch before being active. Cool down and stretch after being active. Give your body time to rest between periods of activity. Maintain physical fitness, including strength and flexibility. Be safe and responsible while being active. This will help you to avoid falls. Wear knee pads if you have to kneel for a long period of time. Contact a health care provider if: Your symptoms do not improve or get worse. Your symptoms keep coming back after treatment. You develop a fever and have warmth, redness, or swelling over your knee. Summary Prepatellar bursitis is inflammation of the prepatellar bursa, which is a fluid-filled sac that cushions the kneecap (patella). This condition may be caused by injury or constant pressure on the knee. It may also be caused by an infection from bacteria. Symptoms of this condition include pain, swelling, warmth, and tenderness in the knee. Follow instructions from your health care provider about taking medicines, resting, and doing activities. Contact your health care provider if your symptoms do not improve, get worse, or keep coming back after treatment. This information is not intended to replace advice given to you by your health care provider. Make sure you discuss any questions you have with your health care provider. Document Revised: 10/03/2018 Document Reviewed: 08/21/2018 Elsevier Patient Education  [place].   Acute Knee Pain,  Adult Acute knee pain is sudden and may be caused by damage, swelling, or irritation of the muscles and tissues that support the knee. Pain may result from: A fall. An injury to the knee from twisting motions. A hit to the knee. Infection. Acute knee pain may go away on its own with time and rest. If it does not, your health care provider may order tests to find the cause of the pain. These may include: Imaging tests, such as an X-ray, MRI, CT scan, or ultrasound. Joint aspiration. In this test, fluid is removed from the knee and evaluated. Arthroscopy. In this test, a lighted tube is inserted into the knee and an image is projected onto a TV screen. Biopsy. In this test, a sample of tissue is removed from the body and studied under a microscope. Follow these instructions at home: If you have a knee sleeve or brace:  Wear the knee sleeve or brace as told by your health care provider. Remove it only as told by your health care provider. Loosen it if your toes tingle, become numb, or turn cold and blue. Keep it clean. If the knee sleeve or brace is not waterproof: Do not let it get wet. Cover it with a watertight covering  when you take a bath or shower. Activity Rest your knee. Do not do things that cause pain or make pain worse. Avoid high-impact activities or exercises, such as running, jumping rope, or doing jumping jacks. Work with a physical therapist to make a safe exercise program, as recommended by your health care provider. Do exercises as told by your physical therapist. Managing pain, stiffness, and swelling  If directed, put ice on the affected knee. To do this: If you have a removable knee sleeve or brace, remove it as told by your health care provider. Put ice in a plastic bag. Place a towel between your skin and the bag. Leave the ice on for 20 minutes, 2-3 times a day. Remove the ice if your skin turns bright red. This is very important. If you cannot feel pain, heat, or  cold, you have a greater risk of damage to the area. If directed, use an elastic bandage to put pressure (compression) on your injured knee. This may control swelling, give support, and help with discomfort. Raise (elevate) your knee above the level of your heart while you are sitting or lying down. Sleep with a pillow under your knee. General instructions Take over-the-counter and prescription medicines only as told by your health care provider. Do not use any products that contain nicotine or tobacco, such as cigarettes, e-cigarettes, and chewing tobacco. If you need help quitting, ask your health care provider. If you are overweight, work with your health care provider and a dietitian to set a weight-loss goal that is healthy and reasonable for you. Extra weight can put pressure on your knee. Pay attention to any changes in your symptoms. Keep all follow-up visits. This is important. Contact a health care provider if: Your knee pain continues, changes, or gets worse. You have a fever along with knee pain. Your knee feels warm to the touch or is red. Your knee buckles or locks up. Get help right away if: Your knee swells, and the swelling becomes worse. You cannot move your knee. You have severe pain in your knee that cannot be managed with pain medicine. Summary Acute knee pain can be caused by a fall, an injury, an infection, or damage, swelling, or irritation of the tissues that support your knee. Your health care provider may perform tests to find out the cause of the pain. Pay attention to any changes in your symptoms. Relieve your pain with rest, medicines, light activity, and the use of ice. Get help right away if your knee swells, you cannot move your knee, or you have severe pain that cannot be managed with medicine. This information is not intended to replace advice given to you by your health care provider. Make sure you discuss any questions you have with your health care  provider. Document Revised: 11/25/2019 Document Reviewed: 11/25/2019 Elsevier Patient Education  [place].

## 2022-03-01 NOTE — Progress Notes (Signed)
Subjective:  Patient ID: Dale Butler, male    DOB: [birth year]  Age: 56 y.o. MRN: [id number]  CC:  Chief Complaint  Patient presents with   Bilateral Knee Pain    Pt states left knee hurts getting up and getting back down is painful, pt states he could not straighten it out, been rotating hot and cold and has brought the swelling down, pt has not taken anything OTC    HPI Dale Butler presents for   Bilateral knee pain: Noticed since some increased work recently - past 3 months. 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  No known injury. Sore in front of knees during work, then pain into knee. Not using knee pads. Some initial swelling, then improved.  Initially worse in left knee, now more in R knee since yesterday.  No mechanical symptoms.  No prior knee surgery, injections.  No hx of gout.  Initially more pain, improving with rest this past holiday weekend.   Tx; ice, heat, biofreeze - helped swelling.   History Patient Active Problem List   Diagnosis Date Noted   GERD (gastroesophageal reflux disease) 12/26/2011   Smoker 12/26/2011   Chest pain 12/26/2011   Past Medical History:  Diagnosis Date   Chest pain    GERD (gastroesophageal reflux disease)    History reviewed. No pertinent surgical history. No Known Allergies Prior to Admission medications   Medication Sig Start Date End Date Taking? Authorizing Provider  cyclobenzaprine (FLEXERIL) 5 MG tablet 1 pill by mouth up to every 8 hours as needed. Start with one pill by mouth each bedtime as needed due to sedation Patient not taking: Reported on 03/01/2022 05/09/20   Wendie Agreste, MD  meloxicam (MOBIC) 7.5 MG tablet Take 1 tablet (7.5 mg total) by mouth daily. Patient not taking: Reported on 03/01/2022 05/09/20   Wendie Agreste, MD   Social History   Socioeconomic History   Marital status: Married     Spouse name: Not on file   Number of children: Not on file   Years of education: Not on file   Highest education level: Not on file  Occupational History   Not on file  Tobacco Use   Smoking status: Every Day    Packs/day: 0.25    Types: Cigarettes   Smokeless tobacco: Never  Vaping Use   Vaping Use: Never used  Substance and Sexual Activity   Alcohol use: Yes    Alcohol/week: 0.0 standard drinks of alcohol    Comment: daily- beer   Drug use: No   Sexual activity: Not on file  Other Topics Concern   Not on file  Social History Narrative   Not on file   Social Determinants of Health   Financial Resource Strain: Not on file  Food Insecurity: Not on file  Transportation Needs: Not on file  Physical Activity: Not on file  Stress: Not on file  Social Connections: Not on file  Intimate Partner Violence: Not on file    Review of Systems  Per HPI Objective:   Vitals:   03/01/22 1058 03/01/22 1112  BP: (!) 140/88 138/80  Pulse: 74   Temp: 98 F (36.7 C)   SpO2: 97%   Weight: 184 lb 9.6 oz (83.7 kg)   Height: '5\' 11"'$  (1.803 m)      Physical Exam Constitutional:      General: He  is not in acute distress.    Appearance: Normal appearance. He is well-developed.  HENT:     Head: Normocephalic and atraumatic.  Cardiovascular:     Rate and Rhythm: Normal rate.  Pulmonary:     Effort: Pulmonary effort is normal.  Musculoskeletal:     Comments: Right knee, skin intact, no ecchymosis or erythema.  No wounds.  Full range of motion.  No appreciable effusion or localized swelling.  Minimal tenderness at the lower patella but no focal bony tenderness.  No appreciable bursal swelling.  Negative varus, valgus, McMurray, drawer.   Left knee, skin intact, no ecchymosis or erythema, no wounds, full range of motion.  No appreciable effusion or localized swelling.  Patella nontender.  No focal bony tenderness or joint line tenderness.  No appreciable bursal swelling at this  time, negative varus, valgus, McMurray, drawer.  Bilateral patellar compression test without localized discomfort.  Neurological:     Mental Status: He is alert and oriented to person, place, and time.  Psychiatric:        Mood and Affect: Mood normal.        Assessment & Plan:  Dale Butler is a 56 y.o. male . Acute pain of both knees  Prepatellar bursitis of both knees Based on history likely have component of patellofemoral pain versus prepatellar bursitis with reported swelling.  Improvement with rest and activity modification.  Continued activity modification, knee protection discussed with kneepads if any knee walking but advised to minimize knee walking if at all possible.  Topical diclofenac if needed with RTC precautions.  Reassuring exam at present and without specific injury or trauma imaging deferred at this time.  No orders of the defined types were placed in this encounter.  Patient Instructions  I suspect you had a possible bursitis in the knee or some inflammation behind the kneecap with the previous work.  Okay to use over-the-counter Biofreeze or other muscle creams if those help but Voltaren gel may work a little better.  That is also over-the-counter and can be used a few times each day.  Wear knee pads with any work on your knees, but try to avoid too much direct pressure to the kneecaps.  If soreness does not continue to improve in the next week to 10 days, let me know and we can look at other treatments or possible imaging.  Keep follow-up for physical as planned but let me know if there are questions sooner.  Take care  Prepatellar Bursitis  Prepatellar bursitis is inflammation of the prepatellar bursa, which is a fluid-filled sac that cushions the kneecap (patella). Prepatellar bursitis happens when fluid builds up in this sac and causes it to swell. The condition causes knee pain. What are the causes? This condition may be caused by: Constant pressure on the  knees from kneeling. A hit to the knee. Falling on the knee. Infection from bacteria. Moving the knee often in a forceful way. What increases the risk? You are more likely to develop this condition if: You play sports that have a high risk of falling on the knee or being hit on the knee. These include football, wrestling, basketball, or soccer. You do work in which you kneel for long periods of time, such as roofing, plumbing, or gardening. You have another inflammatory condition, such as gout or rheumatoid arthritis. What are the signs or symptoms? The most common symptom of this condition is knee pain that gets better with rest. Other symptoms include: Swelling on  the front of the kneecap. Warmth in the knee. Tenderness with activity. Redness in the knee. Inability to bend the knee or to kneel. How is this diagnosed? This condition is diagnosed based on: A physical exam. Your health care provider will compare your knees and check for tenderness and pain while moving your knee. Your medical history. Tests to check for infection. These may include blood tests and tests on the fluid in the bursa. Imaging tests, such as X-ray, MRI, or ultrasound, to check for damage in the patella, or fluid buildup and swelling in the bursa. How is this treated? This condition may be treated by: Resting the knee. Putting ice on the knee. Taking medicines, such as: NSAIDs. These can help to reduce pain and swelling. Antibiotics. These may be needed if you have an infection. Steroids. These are used to reduce swelling and inflammation, and may be prescribed if other treatments are not helping. Raising (elevating) the knee while resting. Doing exercises to help you maintain movement (physical therapy). These may be recommended after pain and swelling improve. Having a procedure to remove fluid from the bursa. This may be done if other treatments are not helping. Having surgery to remove the bursa. This  may be done if you have a severe infection or if the condition keeps coming back after treatment. Follow these instructions at home: Medicines Take over-the-counter and prescription medicines only as told by your health care provider. If you were prescribed an antibiotic medicine, take it as told by your health care provider. Do not stop taking the antibiotic even if you start to feel better. Managing pain, stiffness, and swelling  If directed, put ice on the injured area. Put ice in a plastic bag. Place a towel between your skin and the bag. Leave the ice on for 20 minutes, 2-3 times a day. Elevate the injured area above the level of your heart while you are sitting or lying down. Activity Do not use the injured limb to support your body weight until your health care provider says that you can. Rest your knee. Avoid activities that cause knee pain. Return to your normal activities as told by your health care provider. Ask your health care provider what activities are safe for you. Do exercises as told by your health care provider. General instructions Ask your health care provider when it is safe for you to drive. Do not use any products that contain nicotine or tobacco, such as cigarettes, e-cigarettes, and chewing tobacco. These can delay healing. If you need help quitting, ask your health care provider. Keep all follow-up visits as told by your health care provider. This is important. How is this prevented? Warm up and stretch before being active. Cool down and stretch after being active. Give your body time to rest between periods of activity. Maintain physical fitness, including strength and flexibility. Be safe and responsible while being active. This will help you to avoid falls. Wear knee pads if you have to kneel for a long period of time. Contact a health care provider if: Your symptoms do not improve or get worse. Your symptoms keep coming back after treatment. You develop  a fever and have warmth, redness, or swelling over your knee. Summary Prepatellar bursitis is inflammation of the prepatellar bursa, which is a fluid-filled sac that cushions the kneecap (patella). This condition may be caused by injury or constant pressure on the knee. It may also be caused by an infection from bacteria. Symptoms of this condition include  pain, swelling, warmth, and tenderness in the knee. Follow instructions from your health care provider about taking medicines, resting, and doing activities. Contact your health care provider if your symptoms do not improve, get worse, or keep coming back after treatment. This information is not intended to replace advice given to you by your health care provider. Make sure you discuss any questions you have with your health care provider. Document Revised: 10/03/2018 Document Reviewed: 08/21/2018 Elsevier Patient Education  [place].   Acute Knee Pain, Adult Acute knee pain is sudden and may be caused by damage, swelling, or irritation of the muscles and tissues that support the knee. Pain may result from: A fall. An injury to the knee from twisting motions. A hit to the knee. Infection. Acute knee pain may go away on its own with time and rest. If it does not, your health care provider may order tests to find the cause of the pain. These may include: Imaging tests, such as an X-ray, MRI, CT scan, or ultrasound. Joint aspiration. In this test, fluid is removed from the knee and evaluated. Arthroscopy. In this test, a lighted tube is inserted into the knee and an image is projected onto a TV screen. Biopsy. In this test, a sample of tissue is removed from the body and studied under a microscope. Follow these instructions at home: If you have a knee sleeve or brace:  Wear the knee sleeve or brace as told by your health care provider. Remove it only as told by your health care provider. Loosen it if your toes tingle, become numb,  or turn cold and blue. Keep it clean. If the knee sleeve or brace is not waterproof: Do not let it get wet. Cover it with a watertight covering when you take a bath or shower. Activity Rest your knee. Do not do things that cause pain or make pain worse. Avoid high-impact activities or exercises, such as running, jumping rope, or doing jumping jacks. Work with a physical therapist to make a safe exercise program, as recommended by your health care provider. Do exercises as told by your physical therapist. Managing pain, stiffness, and swelling  If directed, put ice on the affected knee. To do this: If you have a removable knee sleeve or brace, remove it as told by your health care provider. Put ice in a plastic bag. Place a towel between your skin and the bag. Leave the ice on for 20 minutes, 2-3 times a day. Remove the ice if your skin turns bright red. This is very important. If you cannot feel pain, heat, or cold, you have a greater risk of damage to the area. If directed, use an elastic bandage to put pressure (compression) on your injured knee. This may control swelling, give support, and help with discomfort. Raise (elevate) your knee above the level of your heart while you are sitting or lying down. Sleep with a pillow under your knee. General instructions Take over-the-counter and prescription medicines only as told by your health care provider. Do not use any products that contain nicotine or tobacco, such as cigarettes, e-cigarettes, and chewing tobacco. If you need help quitting, ask your health care provider. If you are overweight, work with your health care provider and a dietitian to set a weight-loss goal that is healthy and reasonable for you. Extra weight can put pressure on your knee. Pay attention to any changes in your symptoms. Keep all follow-up visits. This is important. Contact a health care  provider if: Your knee pain continues, changes, or gets worse. You have a  fever along with knee pain. Your knee feels warm to the touch or is red. Your knee buckles or locks up. Get help right away if: Your knee swells, and the swelling becomes worse. You cannot move your knee. You have severe pain in your knee that cannot be managed with pain medicine. Summary Acute knee pain can be caused by a fall, an injury, an infection, or damage, swelling, or irritation of the tissues that support your knee. Your health care provider may perform tests to find out the cause of the pain. Pay attention to any changes in your symptoms. Relieve your pain with rest, medicines, light activity, and the use of ice. Get help right away if your knee swells, you cannot move your knee, or you have severe pain that cannot be managed with medicine. This information is not intended to replace advice given to you by your health care provider. Make sure you discuss any questions you have with your health care provider. Document Revised: 11/25/2019 Document Reviewed: 11/25/2019 Elsevier Patient Education  [place],   Merri Ray, MD [place], [facility] 03/01/22 12:05 PM

## 2022-04-05 ENCOUNTER — Encounter: Payer: Self-pay | Admitting: Family Medicine

## 2022-04-05 ENCOUNTER — Ambulatory Visit (INDEPENDENT_AMBULATORY_CARE_PROVIDER_SITE_OTHER): Payer: BC Managed Care – PPO | Admitting: Family Medicine

## 2022-04-05 VITALS — BP 122/70 | HR 79 | Temp 98.0°F | Ht 71.0 in | Wt 188.2 lb

## 2022-04-05 DIAGNOSIS — E785 Hyperlipidemia, unspecified: Secondary | ICD-10-CM | POA: Diagnosis not present

## 2022-04-05 DIAGNOSIS — Z131 Encounter for screening for diabetes mellitus: Secondary | ICD-10-CM | POA: Diagnosis not present

## 2022-04-05 DIAGNOSIS — Z0000 Encounter for general adult medical examination without abnormal findings: Secondary | ICD-10-CM

## 2022-04-05 DIAGNOSIS — F1011 Alcohol abuse, in remission: Secondary | ICD-10-CM | POA: Diagnosis not present

## 2022-04-05 NOTE — Progress Notes (Signed)
Subjective:  Patient ID: Dale Butler, male    DOB: 10/13/1965  Age: 56 y.o. MRN: [id number]  CC:  Chief Complaint  Patient presents with   Annual Exam    Pt states all is well    HPI Dale Butler presents for Annual Exam Last physical in 2020. Doing well., no new health concerns. Knees have improved from last visit. Has been able to adjust activity/work and less knee bending and knee walking/pressure.    Care team PCP me Gastroenterology, Dr. Tarri Glenn colonoscopy 01/14/2019, Suspected polyp that was removed turned out to be benign polypoid colorectal mucosa, repeat 10 years.  Hyperlipidemia: Most recent testing in 2020.  Total cholesterol had increased at that time.  Was not fasting for that blood work.  Plan for diet, exercise and recheck levels in 6 months. No FH of early CAD The ASCVD Risk score (Arnett DK, et al., 2019) failed to calculate for the following reasons:   Cannot find a previous HDL lab   Cannot find a previous total cholesterol lab   Unable to determine if patient is Non-Hispanic African American  Lab Results  Component Value Date   CHOL 241 (H) 10/06/2018   HDL 43 10/06/2018   LDLCALC Comment 10/06/2018   TRIG 434 (H) 10/06/2018   CHOLHDL 5.6 (H) 10/06/2018   Lab Results  Component Value Date   ALT 21 09/16/2017   AST 36 09/16/2017   ALKPHOS 76 09/16/2017   BILITOT 0.4 09/16/2017       04/05/2022    1:47 PM 03/01/2022   10:54 AM 05/09/2020   11:53 AM 05/09/2020   11:52 AM 04/05/2020    3:56 PM  Depression screen PHQ 2/9  Decreased Interest 2 0 0 0 0  Down, Depressed, Hopeless 0 0 0 0 0  PHQ - 2 Score 2 0 0 0 0  Altered sleeping 0 0     Tired, decreased energy 0 0     Change in appetite 0 0     Feeling bad or failure about yourself  0 0     Trouble concentrating 0 0     Moving slowly or fidgety/restless 0 0     Suicidal thoughts 0 0     PHQ-9 Score 2 0      Health Maintenance  Topic Date Due   COVID-19 Vaccine (1) 04/21/2022  (Originally 11/12/1966)   Zoster Vaccines- Shingrix (1 of 2) 05/31/2022 (Originally 05/16/2015)   INFLUENZA VACCINE  09/23/2022 (Originally 01/23/2022)   Hepatitis C Screening  03/02/2023 (Originally 05/16/1983)   HIV Screening  03/02/2023 (Originally 05/15/1980)   TETANUS/TDAP  08/06/2022   COLONOSCOPY (Pts 45-38yr Insurance coverage will need to be confirmed)  01/13/2029   HPV VACCINES  Aged Out  As above colonoscopy 01/14/2019, repeat 10 years Prostate: does NOT have family history of prostate cancer The natural history of prostate cancer and ongoing controversy regarding screening and potential treatment outcomes of prostate cancer has been discussed with the patient. The meaning of a false positive PSA and a false negative PSA has been discussed. He indicates understanding of the limitations of this screening test and wishes NOT to proceed with screening PSA testing.  Lab Results  Component Value Date   PSA1 0.6 10/06/2018   PSA1 0.6 09/16/2017    Immunization History  Administered Date(s) Administered   Influenza, Seasonal, Injecte, Preservative Fre 08/06/2012   Tdap 08/06/2012  COVID, shingles, flu vaccines declined.  Hepatitis C and HIV screening discussed - declines both  No results found. Optho - wears glasses. Last visit 1-2 years ago. Plans on appt soon with Dr. Idolina Primer.  Dental: every 6 months, recent visit.   Alcohol: 3 beers per day when discussed in 2020.  Decreased use recommended and resources provided if needed.  Has been trying to cut back recently. 3-4  8oz cans beer per day. Alcohol addiction in the family. Was drinking more prior to 3 moths ago - 10-12 beers per day with 3 shots of liquor per day.  Tobacco: 4 cigarettes/day when discussed in 2020.  Was cutting back at that time. Still smoking 3 cigarettes per day. None on weekends.   Exercise: physical job. Fishing, golf, walking.    [place] Office Visit from 04/05/2022 in [facility]  [facility]  AUDIT-C Score 8     AUDIT score 21.    History Patient Active Problem List   Diagnosis Date Noted   GERD (gastroesophageal reflux disease) 12/26/2011   Smoker 12/26/2011   Chest pain 12/26/2011   Past Medical History:  Diagnosis Date   Chest pain    GERD (gastroesophageal reflux disease)    History reviewed. No pertinent surgical history. No Known Allergies Prior to Admission medications   Medication Sig Start Date End Date Taking? Authorizing Provider  cyclobenzaprine (FLEXERIL) 5 MG tablet 1 pill by mouth up to every 8 hours as needed. Start with one pill by mouth each bedtime as needed due to sedation Patient not taking: Reported on 03/01/2022 05/09/20   Wendie Agreste, MD  meloxicam (MOBIC) 7.5 MG tablet Take 1 tablet (7.5 mg total) by mouth daily. Patient not taking: Reported on 03/01/2022 05/09/20   Wendie Agreste, MD   Social History   Socioeconomic History   Marital status: Married    Spouse name: Not on file   Number of children: Not on file   Years of education: Not on file   Highest education level: Not on file  Occupational History   Not on file  Tobacco Use   Smoking status: Every Day    Packs/day: 0.25    Types: Cigarettes   Smokeless tobacco: Never  Vaping Use   Vaping Use: Never used  Substance and Sexual Activity   Alcohol use: Yes    Alcohol/week: 0.0 standard drinks of alcohol    Comment: daily- beer   Drug use: No   Sexual activity: Not on file  Other Topics Concern   Not on file  Social History Narrative   Not on file   Social Determinants of Health   Financial Resource Strain: Not on file  Food Insecurity: Not on file  Transportation Needs: Not on file  Physical Activity: Not on file  Stress: Not on file  Social Connections: Not on file  Intimate Partner Violence: Not on file    Review of Systems 13 point review of systems per patient health survey noted.  Negative other than as indicated above or in  HPI.   Objective:   Vitals:   04/05/22 1348  BP: 122/70  Pulse: 79  Temp: 98 F (36.7 C)  SpO2: 96%  Weight: 188 lb 3.2 oz (85.4 kg)  Height: '5\' 11"'$  (1.803 m)     Physical Exam Vitals reviewed.  Constitutional:      Appearance: He is well-developed.  HENT:     Head: Normocephalic and atraumatic.     Right Ear: External ear normal.     Left Ear: External ear normal.  Eyes:  Conjunctiva/sclera: Conjunctivae normal.     Pupils: Pupils are equal, round, and reactive to light.  Neck:     Thyroid: No thyromegaly.  Cardiovascular:     Rate and Rhythm: Normal rate and regular rhythm.     Heart sounds: Normal heart sounds.  Pulmonary:     Effort: Pulmonary effort is normal. No respiratory distress.     Breath sounds: Normal breath sounds. No wheezing.  Abdominal:     General: There is no distension.     Palpations: Abdomen is soft.     Tenderness: There is no abdominal tenderness.  Musculoskeletal:        General: No tenderness. Normal range of motion.     Cervical back: Normal range of motion and neck supple.  Lymphadenopathy:     Cervical: No cervical adenopathy.  Skin:    General: Skin is warm and dry.  Neurological:     Mental Status: He is alert and oriented to person, place, and time.     Deep Tendon Reflexes: Reflexes are normal and symmetric.  Psychiatric:        Behavior: Behavior normal.        Assessment & Plan:  Dale Butler is a 56 y.o. male . Annual physical exam  - -anticipatory guidance as below in AVS, screening labs above. Health maintenance items as above in HPI discussed/recommended as applicable.  Handout given regarding smoking cessation and different coping techniques to deal with cravings discussed.  History of alcohol abuse - Plan: Comprehensive metabolic panel  -Commended on recent decreases in use but heavy alcohol use as above prior.  Concerns discussed regarding family history of addiction.  Handout given with resources if  difficulty with continued decreased use or return to higher use of alcohol.  Hyperlipidemia, unspecified hyperlipidemia type - Plan: Lipid panel, Hemoglobin A1c  -Slight elevation previously, recheck labs and determine need for statins based on ASCVD risk score.  Screening for diabetes mellitus Check labs  No orders of the defined types were placed in this encounter.  Patient Instructions  See info on smoking cessation below and try other techniques during the workday if stress at work is a trigger.  132mns exercise per week - activity at work counts.  If any concerns on labs I will let you know. Thanks for coming in today and take care.  Keep up the good work with cutting back on alcohol. If trouble decreasing or increased use please seek help - here are a few resources:  [facility] Address: [address] [place] [address] Phone: [phone] [facility] ([facility])  Address: [address] [place] [address] Phone: [phone] [facility]: [address] [address] Phone: [phone]   Preventive Care [id number] Old, Male Preventive care refers to lifestyle choices and visits with your health care provider that can promote health and wellness. Preventive care visits are also called wellness exams. What can I expect for my preventive care visit? Counseling During your preventive care visit, your health care provider may ask about your: Medical history, including: Past medical problems. Family medical history. Current health, including: Emotional well-being. Home life and relationship well-being. Sexual activity. Lifestyle, including: Alcohol, nicotine or tobacco, and drug use. Access to firearms. Diet, exercise, and sleep habits. Safety issues such as seatbelt and bike helmet use. Sunscreen use. Work and work eStatistician Physical exam Your health care provider will check your: Height and  weight.  These may be used to calculate your BMI (body mass index). BMI is a measurement that tells if you are at a healthy weight. Waist circumference. This measures the distance around your waistline. This measurement also tells if you are at a healthy weight and may help predict your risk of certain diseases, such as type 2 diabetes and high blood pressure. Heart rate and blood pressure. Body temperature. Skin for abnormal spots. What immunizations do I need?  Vaccines are usually given at various ages, according to a schedule. Your health care provider will recommend vaccines for you based on your age, medical history, and lifestyle or other factors, such as travel or where you work. What tests do I need? Screening Your health care provider may recommend screening tests for certain conditions. This may include: Lipid and cholesterol levels. Diabetes screening. This is done by checking your blood sugar (glucose) after you have not eaten for a while (fasting). Hepatitis B test. Hepatitis C test. HIV (human immunodeficiency virus) test. STI (sexually transmitted infection) testing, if you are at risk. Lung cancer screening. Prostate cancer screening. Colorectal cancer screening. Talk with your health care provider about your test results, treatment options, and if necessary, the need for more tests. Follow these instructions at home: Eating and drinking  Eat a diet that includes fresh fruits and vegetables, whole grains, lean protein, and low-fat dairy products. Take vitamin and mineral supplements as recommended by your health care provider. Do not drink alcohol if your health care provider tells you not to drink. If you drink alcohol: Limit how much you have to 0-2 drinks a day. Know how much alcohol is in your drink. In the [place] one drink equals one 12 oz bottle of beer (355 mL), one 5 oz glass of wine (148 mL), or one 1 oz glass of hard liquor (44 mL). Lifestyle Brush your teeth every  morning and night with fluoride toothpaste. Floss one time each day. Exercise for at least 30 minutes 5 or more days each week. Do not use any products that contain nicotine or tobacco. These products include cigarettes, chewing tobacco, and vaping devices, such as e-cigarettes. If you need help quitting, ask your health care provider. Do not use drugs. If you are sexually active, practice safe sex. Use a condom or other form of protection to prevent STIs. Take aspirin only as told by your health care provider. Make sure that you understand how much to take and what form to take. Work with your health care provider to find out whether it is safe and beneficial for you to take aspirin daily. Find healthy ways to manage stress, such as: Meditation, yoga, or listening to music. Journaling. Talking to a trusted person. Spending time with friends and family. Minimize exposure to UV radiation to reduce your risk of skin cancer. Safety Always wear your seat belt while driving or riding in a vehicle. Do not drive: If you have been drinking alcohol. Do not ride with someone who has been drinking. When you are tired or distracted. While texting. If you have been using any mind-altering substances or drugs. Wear a helmet and other protective equipment during sports activities. If you have firearms in your house, make sure you follow all gun safety procedures. What's next? Go to your health care provider once a year for an annual wellness visit. Ask your health care provider how often you should have your eyes and teeth checked. Stay up to date on all vaccines. This information  is not intended to replace advice given to you by your health care provider. Make sure you discuss any questions you have with your health care provider. Document Revised: 12/07/2020 Document Reviewed: 12/07/2020 Elsevier Patient Education  [place] of Quitting Smoking Quitting smoking is a  physical and mental challenge. You may have cravings, withdrawal symptoms, and temptation to smoke. Before quitting, work with your health care provider to make a plan that can help you manage quitting. Making a plan before you quit may keep you from smoking when you have the urge to smoke while trying to quit. How to manage lifestyle changes Managing stress Stress can make you want to smoke, and wanting to smoke may cause stress. It is important to find ways to manage your stress. You could try some of the following: Practice relaxation techniques. Breathe slowly and deeply, in through your nose and out through your mouth. Listen to music. Soak in a bath or take a shower. Imagine a peaceful place or vacation. Get some support. Talk with family or friends about your stress. Join a support group. Talk with a counselor or therapist. Get some physical activity. Go for a walk, run, or bike ride. Play a favorite sport. Practice yoga.  Medicines Talk with your health care provider about medicines that might help you deal with cravings and make quitting easier for you. Relationships Social situations can be difficult when you are quitting smoking. To manage this, you can: Avoid parties and other social situations where people might be smoking. Avoid alcohol. Leave right away if you have the urge to smoke. Explain to your family and friends that you are quitting smoking. Ask for support and let them know you might be a bit grumpy. Plan activities where smoking is not an option. General instructions Be aware that many people gain weight after they quit smoking. However, not everyone does. To keep from gaining weight, have a plan in place before you quit, and stick to the plan after you quit. Your plan should include: Eating healthy snacks. When you have a craving, it may help to: Eat popcorn, or try carrots, celery, or other cut vegetables. Chew sugar-free gum. Changing how you eat. Eat small  portion sizes at meals. Eat 4-6 small meals throughout the day instead of 1-2 large meals a day. Be mindful when you eat. You should avoid watching television or doing other things that might distract you as you eat. Exercising regularly. Make time to exercise each day. If you do not have time for a long workout, do short bouts of exercise for 5-10 minutes several times a day. Do some form of strengthening exercise, such as weight lifting. Do some exercise that gets your heart beating and causes you to breathe deeply, such as walking fast, running, swimming, or biking. This is very important. Drinking plenty of water or other low-calorie or no-calorie drinks. Drink enough fluid to keep your urine pale yellow.  How to recognize withdrawal symptoms Your body and mind may experience discomfort as you try to get used to not having nicotine in your system. These effects are called withdrawal symptoms. They may include: Feeling hungrier than normal. Having trouble concentrating. Feeling irritable or restless. Having trouble sleeping. Feeling depressed. Craving a cigarette. These symptoms may surprise you, but they are normal to have when quitting smoking. To manage withdrawal symptoms: Avoid places, people, and activities that trigger your cravings. Remember why you want to quit. Get plenty of sleep. Avoid  coffee and other drinks that contain caffeine. These may worsen some of your symptoms. How to manage cravings Come up with a plan for how to deal with your cravings. The plan should include the following: A definition of the specific situation you want to deal with. An activity or action you will take to replace smoking. A clear idea for how this action will help. The name of someone who could help you with this. Cravings usually last for 5-10 minutes. Consider taking the following actions to help you with your plan to deal with cravings: Keep your mouth busy. Chew sugar-free gum. Suck on  hard candies or a straw. Brush your teeth. Keep your hands and body busy. Change to a different activity right away. Squeeze or play with a ball. Do an activity or a hobby, such as making bead jewelry, practicing needlepoint, or working with wood. Mix up your normal routine. Take a short exercise break. Go for a quick walk, or run up and down stairs. Focus on doing something kind or helpful for someone else. Call a friend or family member to talk during a craving. Join a support group. Contact a quitline. Where to find support To get help or find a support group: Call the [employer]'s Smoking Quitline: 1-800-QUIT-NOW (249) 097-8714) Text QUIT to SmokefreeTXT: 021115 Where to find more information Visit these websites to find more information on quitting smoking: U.S. Department of Health and Human Services: www.smokefree.gov American Lung Association: www.freedomfromsmoking.org Centers for Disease Control and Prevention (CDC): http://www.wolf.info/ American Heart Association: www.heart.org Contact a health care provider if: You want to change your plan for quitting. The medicines you are taking are not helping. Your eating feels out of control or you cannot sleep. You feel depressed or become very anxious. Summary Quitting smoking is a physical and mental challenge. You will face cravings, withdrawal symptoms, and temptation to smoke again. Preparation can help you as you go through these challenges. Try different techniques to manage stress, handle social situations, and prevent weight gain. You can deal with cravings by keeping your mouth busy (such as by chewing gum), keeping your hands and body busy, calling family or friends, or contacting a quitline for people who want to quit smoking. You can deal with withdrawal symptoms by avoiding places where people smoke, getting plenty of rest, and avoiding drinks that contain caffeine. This information is not intended to replace advice  given to you by your health care provider. Make sure you discuss any questions you have with your health care provider. Document Revised: 06/02/2021 Document Reviewed: 06/02/2021 Elsevier Patient Education  2023 [place],   Merri Ray, MD [place], [facility] 04/05/22 2:51 PM

## 2022-04-05 NOTE — Patient Instructions (Addendum)
See info on smoking cessation below and try other techniques during the workday if stress at work is a trigger.  144mns exercise per week - activity at work counts.  If any concerns on labs I will let you know. Thanks for coming in today and take care.  Keep up the good work with cutting back on alcohol. If trouble decreasing or increased use please seek help - here are a few resources:  [facility] Address: [address] [place] [place] [address] Phone: [phone] [facility])  Address: [address] [place] [address] Phone: [phone] [place]: [address]  [address] Phone: [phone]   Preventive Care [id number] Old, Male Preventive care refers to lifestyle choices and visits with your health care provider that can promote health and wellness. Preventive care visits are also called wellness exams. What can I expect for my preventive care visit? Counseling During your preventive care visit, your health care provider may ask about your: Medical history, including: Past medical problems. Family medical history. Current health, including: Emotional well-being. Home life and relationship well-being. Sexual activity. Lifestyle, including: Alcohol, nicotine or tobacco, and drug use. Access to firearms. Diet, exercise, and sleep habits. Safety issues such as seatbelt and bike helmet use. Sunscreen use. Work and work eStatistician Physical exam Your health care provider will check your: Height and weight. These may be used to calculate your BMI (body mass index). BMI is a measurement that tells if you are at a healthy weight. Waist circumference. This measures the distance around your waistline. This measurement also tells if you are at a healthy weight and may help predict your risk of certain diseases, such as type 2 diabetes and high blood pressure. Heart rate and blood pressure. Body  temperature. Skin for abnormal spots. What immunizations do I need?  Vaccines are usually given at various ages, according to a schedule. Your health care provider will recommend vaccines for you based on your age, medical history, and lifestyle or other factors, such as travel or where you work. What tests do I need? Screening Your health care provider may recommend screening tests for certain conditions. This may include: Lipid and cholesterol levels. Diabetes screening. This is done by checking your blood sugar (glucose) after you have not eaten for a while (fasting). Hepatitis B test. Hepatitis C test. HIV (human immunodeficiency virus) test. STI (sexually transmitted infection) testing, if you are at risk. Lung cancer screening. Prostate cancer screening. Colorectal cancer screening. Talk with your health care provider about your test results, treatment options, and if necessary, the need for more tests. Follow these instructions at home: Eating and drinking  Eat a diet that includes fresh fruits and vegetables, whole grains, lean protein, and low-fat dairy products. Take vitamin and mineral supplements as recommended by your health care provider. Do not drink alcohol if your health care provider tells you not to drink. If you drink alcohol: Limit how much you have to 0-2 drinks a day. Know how much alcohol is in your drink. In the [place] one drink equals one 12 oz bottle of beer (355 mL), one 5 oz glass of wine (148 mL), or one 1 oz glass of hard liquor (44 mL). Lifestyle Brush your teeth every morning and night with fluoride toothpaste. Floss one time each day. Exercise for at least 30 minutes 5 or more days each week. Do not use any products that  contain nicotine or tobacco. These products include cigarettes, chewing tobacco, and vaping devices, such as e-cigarettes. If you need help quitting, ask your health care provider. Do not use drugs. If you are sexually active,  practice safe sex. Use a condom or other form of protection to prevent STIs. Take aspirin only as told by your health care provider. Make sure that you understand how much to take and what form to take. Work with your health care provider to find out whether it is safe and beneficial for you to take aspirin daily. Find healthy ways to manage stress, such as: Meditation, yoga, or listening to music. Journaling. Talking to a trusted person. Spending time with friends and family. Minimize exposure to UV radiation to reduce your risk of skin cancer. Safety Always wear your seat belt while driving or riding in a vehicle. Do not drive: If you have been drinking alcohol. Do not ride with someone who has been drinking. When you are tired or distracted. While texting. If you have been using any mind-altering substances or drugs. Wear a helmet and other protective equipment during sports activities. If you have firearms in your house, make sure you follow all gun safety procedures. What's next? Go to your health care provider once a year for an annual wellness visit. Ask your health care provider how often you should have your eyes and teeth checked. Stay up to date on all vaccines. This information is not intended to replace advice given to you by your health care provider. Make sure you discuss any questions you have with your health care provider. Document Revised: 12/07/2020 Document Reviewed: 12/07/2020 Elsevier Patient Education  [place] of Quitting Smoking Quitting smoking is a physical and mental challenge. You may have cravings, withdrawal symptoms, and temptation to smoke. Before quitting, work with your health care provider to make a plan that can help you manage quitting. Making a plan before you quit may keep you from smoking when you have the urge to smoke while trying to quit. How to manage lifestyle changes Managing stress Stress can make you  want to smoke, and wanting to smoke may cause stress. It is important to find ways to manage your stress. You could try some of the following: Practice relaxation techniques. Breathe slowly and deeply, in through your nose and out through your mouth. Listen to music. Soak in a bath or take a shower. Imagine a peaceful place or vacation. Get some support. Talk with family or friends about your stress. Join a support group. Talk with a counselor or therapist. Get some physical activity. Go for a walk, run, or bike ride. Play a favorite sport. Practice yoga.  Medicines Talk with your health care provider about medicines that might help you deal with cravings and make quitting easier for you. Relationships Social situations can be difficult when you are quitting smoking. To manage this, you can: Avoid parties and other social situations where people might be smoking. Avoid alcohol. Leave right away if you have the urge to smoke. Explain to your family and friends that you are quitting smoking. Ask for support and let them know you might be a bit grumpy. Plan activities where smoking is not an option. General instructions Be aware that many people gain weight after they quit smoking. However, not everyone does. To keep from gaining weight, have a plan in place before you quit, and stick to the plan after you quit. Your plan should include: Eating  healthy snacks. When you have a craving, it may help to: Eat popcorn, or try carrots, celery, or other cut vegetables. Chew sugar-free gum. Changing how you eat. Eat small portion sizes at meals. Eat 4-6 small meals throughout the day instead of 1-2 large meals a day. Be mindful when you eat. You should avoid watching television or doing other things that might distract you as you eat. Exercising regularly. Make time to exercise each day. If you do not have time for a long workout, do short bouts of exercise for 5-10 minutes several times a  day. Do some form of strengthening exercise, such as weight lifting. Do some exercise that gets your heart beating and causes you to breathe deeply, such as walking fast, running, swimming, or biking. This is very important. Drinking plenty of water or other low-calorie or no-calorie drinks. Drink enough fluid to keep your urine pale yellow.  How to recognize withdrawal symptoms Your body and mind may experience discomfort as you try to get used to not having nicotine in your system. These effects are called withdrawal symptoms. They may include: Feeling hungrier than normal. Having trouble concentrating. Feeling irritable or restless. Having trouble sleeping. Feeling depressed. Craving a cigarette. These symptoms may surprise you, but they are normal to have when quitting smoking. To manage withdrawal symptoms: Avoid places, people, and activities that trigger your cravings. Remember why you want to quit. Get plenty of sleep. Avoid coffee and other drinks that contain caffeine. These may worsen some of your symptoms. How to manage cravings Come up with a plan for how to deal with your cravings. The plan should include the following: A definition of the specific situation you want to deal with. An activity or action you will take to replace smoking. A clear idea for how this action will help. The name of someone who could help you with this. Cravings usually last for 5-10 minutes. Consider taking the following actions to help you with your plan to deal with cravings: Keep your mouth busy. Chew sugar-free gum. Suck on hard candies or a straw. Brush your teeth. Keep your hands and body busy. Change to a different activity right away. Squeeze or play with a ball. Do an activity or a hobby, such as making bead jewelry, practicing needlepoint, or working with wood. Mix up your normal routine. Take a short exercise break. Go for a quick walk, or run up and down stairs. Focus on doing  something kind or helpful for someone else. Call a friend or family member to talk during a craving. Join a support group. Contact a quitline. Where to find support To get help or find a support group: Call the [facility]'s Smoking Quitline: 1-800-QUIT-NOW 4056308739) Text QUIT to SmokefreeTXT: 353299 Where to find more information Visit these websites to find more information on quitting smoking: U.S. Department of Health and Human Services: www.smokefree.gov American Lung Association: www.freedomfromsmoking.org Centers for Disease Control and Prevention (CDC): http://www.wolf.info/ American Heart Association: www.heart.org Contact a health care provider if: You want to change your plan for quitting. The medicines you are taking are not helping. Your eating feels out of control or you cannot sleep. You feel depressed or become very anxious. Summary Quitting smoking is a physical and mental challenge. You will face cravings, withdrawal symptoms, and temptation to smoke again. Preparation can help you as you go through these challenges. Try different techniques to manage stress, handle social situations, and prevent weight gain. You can deal with cravings by  keeping your mouth busy (such as by chewing gum), keeping your hands and body busy, calling family or friends, or contacting a quitline for people who want to quit smoking. You can deal with withdrawal symptoms by avoiding places where people smoke, getting plenty of rest, and avoiding drinks that contain caffeine. This information is not intended to replace advice given to you by your health care provider. Make sure you discuss any questions you have with your health care provider. Document Revised: 06/02/2021 Document Reviewed: 06/02/2021 Elsevier Patient Education  [place].

## 2022-04-06 LAB — COMPREHENSIVE METABOLIC PANEL
ALT: 22 U/L (ref 0–53)
AST: 39 U/L — ABNORMAL HIGH (ref 0–37)
Albumin: 4.6 g/dL (ref 3.5–5.2)
Alkaline Phosphatase: 85 U/L (ref 39–117)
BUN: 18 mg/dL (ref 6–23)
CO2: 30 mEq/L (ref 19–32)
Calcium: 9.9 mg/dL (ref 8.4–10.5)
Chloride: 101 mEq/L (ref 96–112)
Creatinine, Ser: 1.3 mg/dL (ref 0.40–1.50)
GFR: 61.25 mL/min (ref >60.00)
Glucose, Bld: 69 mg/dL — ABNORMAL LOW (ref 70–99)
Potassium: 4.5 mEq/L (ref 3.5–5.1)
Sodium: 138 mEq/L (ref 135–145)
Total Bilirubin: 0.5 mg/dL (ref 0.2–1.2)
Total Protein: 8.2 g/dL (ref 6.0–8.3)

## 2022-04-06 LAB — LIPID PANEL
Cholesterol: 238 mg/dL — ABNORMAL HIGH (ref 0–200)
HDL: 55.8 mg/dL (ref >39.00)
NonHDL: 181.95
Total CHOL/HDL Ratio: 4
Triglycerides: 274 mg/dL — ABNORMAL HIGH (ref 0.0–149.0)
VLDL: 54.8 mg/dL — ABNORMAL HIGH (ref 0.0–40.0)

## 2022-04-06 LAB — HEMOGLOBIN A1C: Hgb A1c MFr Bld: 6.1 % (ref 4.6–6.5)

## 2022-04-06 LAB — LDL CHOLESTEROL, DIRECT: Direct LDL: 152 mg/dL

## 2022-10-10 ENCOUNTER — Ambulatory Visit: Payer: BC Managed Care – PPO | Admitting: Family Medicine

## 2023-10-17 ENCOUNTER — Encounter: Admitting: Family Medicine

## 2024-08-20 ENCOUNTER — Encounter: Admitting: Family Medicine
